# Patient Record
Sex: Female | Born: 1975 | Race: White | Hispanic: No | Marital: Single | Smoking: Current every day smoker
Health system: Southern US, Community
[De-identification: ages and names within clinical notes are randomized; demographics above are authoritative.]

## PROBLEM LIST (undated history)

## (undated) DIAGNOSIS — M51379 Other intervertebral disc degeneration, lumbosacral region without mention of lumbar back pain or lower extremity pain: Secondary | ICD-10-CM

## (undated) DIAGNOSIS — J45909 Unspecified asthma, uncomplicated: Secondary | ICD-10-CM

## (undated) DIAGNOSIS — C801 Malignant (primary) neoplasm, unspecified: Secondary | ICD-10-CM

## (undated) DIAGNOSIS — I10 Essential (primary) hypertension: Secondary | ICD-10-CM

## (undated) DIAGNOSIS — M5137 Other intervertebral disc degeneration, lumbosacral region: Secondary | ICD-10-CM

## (undated) DIAGNOSIS — F1020 Alcohol dependence, uncomplicated: Secondary | ICD-10-CM

## (undated) DIAGNOSIS — B1920 Unspecified viral hepatitis C without hepatic coma: Secondary | ICD-10-CM

## (undated) DIAGNOSIS — N289 Disorder of kidney and ureter, unspecified: Secondary | ICD-10-CM

## (undated) HISTORY — PX: OTHER SURGICAL HISTORY: SHX169

---

## 2010-09-08 DIAGNOSIS — F1020 Alcohol dependence, uncomplicated: Secondary | ICD-10-CM | POA: Diagnosis present

## 2012-11-21 DIAGNOSIS — F10239 Alcohol dependence with withdrawal, unspecified: Secondary | ICD-10-CM | POA: Insufficient documentation

## 2012-11-21 DIAGNOSIS — F10939 Alcohol use, unspecified with withdrawal, unspecified: Secondary | ICD-10-CM | POA: Insufficient documentation

## 2012-12-27 DIAGNOSIS — F419 Anxiety disorder, unspecified: Secondary | ICD-10-CM | POA: Insufficient documentation

## 2013-01-30 DIAGNOSIS — Z85820 Personal history of malignant melanoma of skin: Secondary | ICD-10-CM | POA: Insufficient documentation

## 2015-09-15 DIAGNOSIS — F1994 Other psychoactive substance use, unspecified with psychoactive substance-induced mood disorder: Secondary | ICD-10-CM | POA: Insufficient documentation

## 2016-09-28 DIAGNOSIS — F1994 Other psychoactive substance use, unspecified with psychoactive substance-induced mood disorder: Secondary | ICD-10-CM | POA: Insufficient documentation

## 2017-03-05 ENCOUNTER — Emergency Department
Admission: EM | Admit: 2017-03-05 | Discharge: 2017-03-05 | Disposition: A | Discharge status: Home / Self Care | Payer: Self-pay | Attending: Emergency Medicine | Admitting: Emergency Medicine

## 2017-03-05 ENCOUNTER — Emergency Department: Payer: Self-pay

## 2017-03-05 DIAGNOSIS — F10929 Alcohol use, unspecified with intoxication, unspecified: Secondary | ICD-10-CM | POA: Insufficient documentation

## 2017-03-05 DIAGNOSIS — Y929 Unspecified place or not applicable: Secondary | ICD-10-CM | POA: Insufficient documentation

## 2017-03-05 DIAGNOSIS — F17200 Nicotine dependence, unspecified, uncomplicated: Secondary | ICD-10-CM | POA: Insufficient documentation

## 2017-03-05 DIAGNOSIS — Y999 Unspecified external cause status: Secondary | ICD-10-CM | POA: Insufficient documentation

## 2017-03-05 DIAGNOSIS — Z79899 Other long term (current) drug therapy: Secondary | ICD-10-CM | POA: Insufficient documentation

## 2017-03-05 DIAGNOSIS — I10 Essential (primary) hypertension: Secondary | ICD-10-CM | POA: Insufficient documentation

## 2017-03-05 DIAGNOSIS — S0181XA Laceration without foreign body of other part of head, initial encounter: Secondary | ICD-10-CM | POA: Insufficient documentation

## 2017-03-05 DIAGNOSIS — Y939 Activity, unspecified: Secondary | ICD-10-CM | POA: Insufficient documentation

## 2017-03-05 HISTORY — DX: Essential (primary) hypertension: I10

## 2017-03-05 LAB — URINALYSIS, COMPLETE (UACMP) WITH MICROSCOPIC
Bilirubin Urine: NEGATIVE
Glucose, UA: NEGATIVE mg/dL
Hgb urine dipstick: NEGATIVE
Ketones, ur: NEGATIVE mg/dL
Leukocytes, UA: NEGATIVE
Nitrite: NEGATIVE
Protein, ur: NEGATIVE mg/dL
SPECIFIC GRAVITY, URINE: 1.002 — AB (ref 1.005–1.030)
Squamous Epithelial / LPF: NONE SEEN
pH: 7 (ref 5.0–8.0)

## 2017-03-05 LAB — CBC WITH DIFFERENTIAL/PLATELET
Basophils Absolute: 0.1 10*3/uL (ref 0–0.1)
Basophils Relative: 1 %
EOS ABS: 0.1 10*3/uL (ref 0–0.7)
Eosinophils Relative: 1 %
HCT: 45.6 % (ref 35.0–47.0)
HEMOGLOBIN: 15.9 g/dL (ref 12.0–16.0)
LYMPHS ABS: 2.7 10*3/uL (ref 1.0–3.6)
Lymphocytes Relative: 33 %
MCH: 38.4 pg — AB (ref 26.0–34.0)
MCHC: 34.8 g/dL (ref 32.0–36.0)
MCV: 110.1 fL — ABNORMAL HIGH (ref 80.0–100.0)
MONO ABS: 0.4 10*3/uL (ref 0.2–0.9)
MONOS PCT: 5 %
NEUTROS PCT: 60 %
Neutro Abs: 4.8 10*3/uL (ref 1.4–6.5)
Platelets: 209 10*3/uL (ref 150–440)
RBC: 4.14 MIL/uL (ref 3.80–5.20)
RDW: 13.5 % (ref 11.5–14.5)
WBC: 8.1 10*3/uL (ref 3.6–11.0)

## 2017-03-05 LAB — HEPATIC FUNCTION PANEL
ALBUMIN: 4 g/dL (ref 3.5–5.0)
ALK PHOS: 81 U/L (ref 38–126)
ALT: 44 U/L (ref 14–54)
AST: 84 U/L — AB (ref 15–41)
BILIRUBIN TOTAL: 0.6 mg/dL (ref 0.3–1.2)
Bilirubin, Direct: <0.1 mg/dL — ABNORMAL LOW (ref 0.1–0.5)
Total Protein: 7.4 g/dL (ref 6.5–8.1)

## 2017-03-05 LAB — BASIC METABOLIC PANEL
Anion gap: 13 (ref 5–15)
BUN: 7 mg/dL (ref 6–20)
CALCIUM: 8.9 mg/dL (ref 8.9–10.3)
CO2: 24 mmol/L (ref 22–32)
CREATININE: 0.88 mg/dL (ref 0.44–1.00)
Chloride: 103 mmol/L (ref 101–111)
GFR calc non Af Amer: >60 mL/min (ref >60)
Glucose, Bld: 95 mg/dL (ref 65–99)
Potassium: 3.4 mmol/L — ABNORMAL LOW (ref 3.5–5.1)
SODIUM: 140 mmol/L (ref 135–145)

## 2017-03-05 LAB — URINE DRUG SCREEN, QUALITATIVE (ARMC ONLY)
Amphetamines, Ur Screen: NOT DETECTED
Barbiturates, Ur Screen: NOT DETECTED
Benzodiazepine, Ur Scrn: NOT DETECTED
COCAINE METABOLITE, UR ~~LOC~~: NOT DETECTED
Cannabinoid 50 Ng, Ur ~~LOC~~: NOT DETECTED
MDMA (ECSTASY) UR SCREEN: NOT DETECTED
METHADONE SCREEN, URINE: NOT DETECTED
Opiate, Ur Screen: NOT DETECTED
Phencyclidine (PCP) Ur S: NOT DETECTED
TRICYCLIC, UR SCREEN: NOT DETECTED

## 2017-03-05 LAB — RAPID HIV SCREEN (HIV 1/2 AB+AG)
HIV 1/2 Antibodies: NONREACTIVE
HIV-1 P24 ANTIGEN - HIV24: NONREACTIVE

## 2017-03-05 LAB — POCT PREGNANCY, URINE: PREG TEST UR: NEGATIVE

## 2017-03-05 LAB — HCG, QUANTITATIVE, PREGNANCY: hCG, Beta Chain, Quant, S: <1 m[IU]/mL (ref <5)

## 2017-03-05 LAB — ETHANOL: Alcohol, Ethyl (B): 389 mg/dL (ref <5)

## 2017-03-05 MED ORDER — FENTANYL CITRATE (PF) 100 MCG/2ML IJ SOLN
75.0000 ug | Freq: Once | INTRAMUSCULAR | Status: AC
  Administered 2017-03-05: 75 ug via INTRAVENOUS

## 2017-03-05 MED ORDER — HYDROCODONE-ACETAMINOPHEN 5-325 MG PO TABS: 1.0000 | ORAL_TABLET | Freq: Four times a day (QID) | ORAL | 0 refills | Status: DC | PRN

## 2017-03-05 MED ORDER — SODIUM CHLORIDE 0.9 % IV BOLUS (SEPSIS)
1000.0000 mL | Freq: Once | INTRAVENOUS | Status: AC
  Administered 2017-03-05: 1000 mL via INTRAVENOUS

## 2017-03-05 MED ORDER — TETANUS-DIPHTH-ACELL PERTUSSIS 5-2.5-18.5 LF-MCG/0.5 IM SUSP
0.5000 mL | Freq: Once | INTRAMUSCULAR | Status: AC
  Administered 2017-03-05: 0.5 mL via INTRAMUSCULAR
  Filled 2017-03-05: qty 0.5

## 2017-03-05 MED ORDER — LIDOCAINE HCL (PF) 1 % IJ SOLN
INTRAMUSCULAR | Status: AC
  Filled 2017-03-05: qty 10

## 2017-03-05 MED ORDER — LIDOCAINE HCL (PF) 1 % IJ SOLN
10.0000 mL | Freq: Once | INTRAMUSCULAR | Status: AC
  Administered 2017-03-05: 17:00:00 via INTRADERMAL

## 2017-03-05 MED ORDER — FENTANYL CITRATE (PF) 100 MCG/2ML IJ SOLN
INTRAMUSCULAR | Status: AC
  Filled 2017-03-05: qty 2

## 2017-03-05 MED ORDER — HALOPERIDOL LACTATE 5 MG/ML IJ SOLN
5.0000 mg | Freq: Once | INTRAMUSCULAR | Status: AC
  Administered 2017-03-05: 5 mg via INTRAVENOUS
  Filled 2017-03-05: qty 1

## 2017-03-05 MED ORDER — LIDOCAINE-EPINEPHRINE 1 %-1:100000 IJ SOLN
10.0000 mL | Freq: Once | INTRAMUSCULAR | Status: DC
  Filled 2017-03-05: qty 10

## 2017-03-05 MED ORDER — KETOROLAC TROMETHAMINE 30 MG/ML IJ SOLN
15.0000 mg | Freq: Once | INTRAMUSCULAR | Status: AC
  Administered 2017-03-05: 15 mg via INTRAVENOUS
  Filled 2017-03-05: qty 1

## 2017-03-05 MED ORDER — FENTANYL CITRATE (PF) 100 MCG/2ML IJ SOLN
75.0000 ug | Freq: Once | INTRAMUSCULAR | Status: AC
  Administered 2017-03-05: 75 ug via INTRAVENOUS
  Filled 2017-03-05: qty 2
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
## 2017-03-07 ENCOUNTER — Telehealth: Payer: Self-pay | Admitting: Emergency Medicine

## 2017-03-07 LAB — COMMENT2 - HEP PANEL

## 2017-03-07 LAB — HEPATITIS C ANTIBODY (REFLEX)

## 2017-03-07 NOTE — Telephone Encounter (Signed)
Called patient to discuss follow up plans and hepatitis c results and need for further testing.  Would like to give resources for follow up.  I spoke with family member and she took my number and will have pt call me back tomorrow.

## 2017-04-12 DIAGNOSIS — F329 Major depressive disorder, single episode, unspecified: Secondary | ICD-10-CM | POA: Insufficient documentation

## 2017-04-12 DIAGNOSIS — F32A Depression, unspecified: Secondary | ICD-10-CM | POA: Insufficient documentation

## 2017-08-03 DIAGNOSIS — Z3041 Encounter for surveillance of contraceptive pills: Secondary | ICD-10-CM | POA: Insufficient documentation

## 2018-02-26 DIAGNOSIS — F17200 Nicotine dependence, unspecified, uncomplicated: Secondary | ICD-10-CM | POA: Insufficient documentation

## 2018-07-15 ENCOUNTER — Emergency Department: Payer: Self-pay

## 2018-07-15 ENCOUNTER — Emergency Department
Admission: EM | Admit: 2018-07-15 | Discharge: 2018-07-15 | Disposition: A | Discharge status: Home / Self Care | Payer: Self-pay | Attending: Emergency Medicine | Admitting: Emergency Medicine

## 2018-07-15 ENCOUNTER — Other Ambulatory Visit: Payer: Self-pay

## 2018-07-15 DIAGNOSIS — R000 Tachycardia, unspecified: Secondary | ICD-10-CM | POA: Insufficient documentation

## 2018-07-15 DIAGNOSIS — E860 Dehydration: Secondary | ICD-10-CM | POA: Insufficient documentation

## 2018-07-15 DIAGNOSIS — I10 Essential (primary) hypertension: Secondary | ICD-10-CM | POA: Insufficient documentation

## 2018-07-15 DIAGNOSIS — Y908 Blood alcohol level of 240 mg/100 ml or more: Secondary | ICD-10-CM | POA: Insufficient documentation

## 2018-07-15 DIAGNOSIS — F17200 Nicotine dependence, unspecified, uncomplicated: Secondary | ICD-10-CM | POA: Insufficient documentation

## 2018-07-15 DIAGNOSIS — Z79899 Other long term (current) drug therapy: Secondary | ICD-10-CM | POA: Insufficient documentation

## 2018-07-15 DIAGNOSIS — R062 Wheezing: Secondary | ICD-10-CM | POA: Insufficient documentation

## 2018-07-15 DIAGNOSIS — F10121 Alcohol abuse with intoxication delirium: Secondary | ICD-10-CM | POA: Insufficient documentation

## 2018-07-15 DIAGNOSIS — F10921 Alcohol use, unspecified with intoxication delirium: Secondary | ICD-10-CM

## 2018-07-15 LAB — COMPREHENSIVE METABOLIC PANEL
ALBUMIN: 4.6 g/dL (ref 3.5–5.0)
ALT: 35 U/L (ref 0–44)
AST: 51 U/L — ABNORMAL HIGH (ref 15–41)
Alkaline Phosphatase: 82 U/L (ref 38–126)
Anion gap: 15 (ref 5–15)
BUN: 15 mg/dL (ref 6–20)
CALCIUM: 9.4 mg/dL (ref 8.9–10.3)
CHLORIDE: 107 mmol/L (ref 98–111)
CO2: 24 mmol/L (ref 22–32)
CREATININE: 1.02 mg/dL — AB (ref 0.44–1.00)
GFR calc Af Amer: >60 mL/min (ref >60)
GFR calc non Af Amer: >60 mL/min (ref >60)
Glucose, Bld: 98 mg/dL (ref 70–99)
POTASSIUM: 3.9 mmol/L (ref 3.5–5.1)
SODIUM: 146 mmol/L — AB (ref 135–145)
Total Bilirubin: 0.6 mg/dL (ref 0.3–1.2)
Total Protein: 7.9 g/dL (ref 6.5–8.1)

## 2018-07-15 LAB — URINALYSIS, COMPLETE (UACMP) WITH MICROSCOPIC
Bilirubin Urine: NEGATIVE
GLUCOSE, UA: NEGATIVE mg/dL
HGB URINE DIPSTICK: NEGATIVE
Ketones, ur: NEGATIVE mg/dL
Leukocytes, UA: NEGATIVE
Nitrite: NEGATIVE
PH: 6 (ref 5.0–8.0)
Protein, ur: NEGATIVE mg/dL
SPECIFIC GRAVITY, URINE: 1.005 (ref 1.005–1.030)

## 2018-07-15 LAB — CBC WITH DIFFERENTIAL/PLATELET
ABS IMMATURE GRANULOCYTES: 0.02 10*3/uL (ref 0.00–0.07)
BASOS PCT: 0 %
Basophils Absolute: 0 10*3/uL (ref 0.0–0.1)
EOS ABS: 0.2 10*3/uL (ref 0.0–0.5)
Eosinophils Relative: 3 %
HCT: 47.4 % — ABNORMAL HIGH (ref 36.0–46.0)
Hemoglobin: 16.3 g/dL — ABNORMAL HIGH (ref 12.0–15.0)
Immature Granulocytes: 0 %
Lymphocytes Relative: 57 %
Lymphs Abs: 4.5 10*3/uL — ABNORMAL HIGH (ref 0.7–4.0)
MCH: 31.8 pg (ref 26.0–34.0)
MCHC: 34.4 g/dL (ref 30.0–36.0)
MCV: 92.6 fL (ref 80.0–100.0)
MONO ABS: 0.5 10*3/uL (ref 0.1–1.0)
MONOS PCT: 6 %
NEUTROS ABS: 2.7 10*3/uL (ref 1.7–7.7)
NEUTROS PCT: 34 %
PLATELETS: 312 10*3/uL (ref 150–400)
RBC: 5.12 MIL/uL — AB (ref 3.87–5.11)
RDW: 12.2 % (ref 11.5–15.5)
WBC: 8 10*3/uL (ref 4.0–10.5)
nRBC: 0 % (ref 0.0–0.2)

## 2018-07-15 LAB — URINE DRUG SCREEN, QUALITATIVE (ARMC ONLY)
AMPHETAMINES, UR SCREEN: NOT DETECTED
Barbiturates, Ur Screen: NOT DETECTED
Benzodiazepine, Ur Scrn: NOT DETECTED
CANNABINOID 50 NG, UR ~~LOC~~: NOT DETECTED
COCAINE METABOLITE, UR ~~LOC~~: NOT DETECTED
MDMA (ECSTASY) UR SCREEN: NOT DETECTED
Methadone Scn, Ur: NOT DETECTED
OPIATE, UR SCREEN: NOT DETECTED
PHENCYCLIDINE (PCP) UR S: NOT DETECTED
Tricyclic, Ur Screen: NOT DETECTED

## 2018-07-15 LAB — POCT PREGNANCY, URINE: Preg Test, Ur: NEGATIVE

## 2018-07-15 LAB — ACETAMINOPHEN LEVEL

## 2018-07-15 LAB — ETHANOL: ALCOHOL ETHYL (B): 474 mg/dL — AB (ref <10)

## 2018-07-15 LAB — SALICYLATE LEVEL: Salicylate Lvl: <7 mg/dL (ref 2.8–30.0)

## 2018-07-15 LAB — CK: Total CK: 159 U/L (ref 38–234)

## 2018-07-15 MED ORDER — SODIUM CHLORIDE 0.9 % IV BOLUS
1000.0000 mL | Freq: Once | INTRAVENOUS | Status: AC
  Administered 2018-07-15: 1000 mL via INTRAVENOUS

## 2018-07-15 MED ORDER — HALOPERIDOL LACTATE 5 MG/ML IJ SOLN
5.0000 mg | Freq: Once | INTRAMUSCULAR | Status: AC
  Administered 2018-07-15: 5 mg via INTRAVENOUS
  Filled 2018-07-15: qty 1

## 2018-07-15 MED ORDER — IPRATROPIUM-ALBUTEROL 0.5-2.5 (3) MG/3ML IN SOLN
RESPIRATORY_TRACT | Status: AC
  Filled 2018-07-15: qty 3

## 2018-07-15 MED ORDER — IPRATROPIUM-ALBUTEROL 0.5-2.5 (3) MG/3ML IN SOLN
3.0000 mL | Freq: Once | RESPIRATORY_TRACT | Status: AC
  Administered 2018-07-15: 3 mL via RESPIRATORY_TRACT

## 2018-07-15 MED ORDER — ALBUTEROL SULFATE HFA 108 (90 BASE) MCG/ACT IN AERS: 2.0000 | INHALATION_SPRAY | Freq: Four times a day (QID) | RESPIRATORY_TRACT | 0 refills | Status: DC | PRN

## 2018-07-15 NOTE — Discharge Instructions (Signed)
It was a pleasure to take care of you today, and thank you for coming to our emergency department.  If you have any questions or concerns before leaving please ask the nurse to grab me and I'm more than happy to go through your aftercare instructions again.  If you were prescribed any opioid pain medication today such as Norco, Vicodin, Percocet, morphine, hydrocodone, or oxycodone please make sure you do not drive when you are taking this medication as it can alter your ability to drive safely.  If you have any concerns once you are home that you are not improving or are in fact getting worse before you can make it to your follow-up appointment, please do not hesitate to call 911 and come back for further evaluation.  Darel Hong, MD  Results for orders placed or performed during the hospital encounter of 07/15/18  Acetaminophen level  Result Value Ref Range   Acetaminophen (Tylenol), Serum <10 (L) 10 - 30 ug/mL  Comprehensive metabolic panel  Result Value Ref Range   Sodium 146 (H) 135 - 145 mmol/L   Potassium 3.9 3.5 - 5.1 mmol/L   Chloride 107 98 - 111 mmol/L   CO2 24 22 - 32 mmol/L   Glucose, Bld 98 70 - 99 mg/dL   BUN 15 6 - 20 mg/dL   Creatinine, Ser 1.02 (H) 0.44 - 1.00 mg/dL   Calcium 9.4 8.9 - 10.3 mg/dL   Total Protein 7.9 6.5 - 8.1 g/dL   Albumin 4.6 3.5 - 5.0 g/dL   AST 51 (H) 15 - 41 U/L   ALT 35 0 - 44 U/L   Alkaline Phosphatase 82 38 - 126 U/L   Total Bilirubin 0.6 0.3 - 1.2 mg/dL   GFR calc non Af Amer >60 >60 mL/min   GFR calc Af Amer >60 >60 mL/min   Anion gap 15 5 - 15  Ethanol  Result Value Ref Range   Alcohol, Ethyl (B) 474 (HH) <86 mg/dL  Salicylate level  Result Value Ref Range   Salicylate Lvl <5.7 2.8 - 30.0 mg/dL  CBC with Differential  Result Value Ref Range   WBC 8.0 4.0 - 10.5 K/uL   RBC 5.12 (H) 3.87 - 5.11 MIL/uL   Hemoglobin 16.3 (H) 12.0 - 15.0 g/dL   HCT 47.4 (H) 36.0 - 46.0 %   MCV 92.6 80.0 - 100.0 fL   MCH 31.8 26.0 - 34.0 pg   MCHC 34.4 30.0 - 36.0 g/dL   RDW 12.2 11.5 - 15.5 %   Platelets 312 150 - 400 K/uL   nRBC 0.0 0.0 - 0.2 %   Neutrophils Relative % 34 %   Neutro Abs 2.7 1.7 - 7.7 K/uL   Lymphocytes Relative 57 %   Lymphs Abs 4.5 (H) 0.7 - 4.0 K/uL   Monocytes Relative 6 %   Monocytes Absolute 0.5 0.1 - 1.0 K/uL   Eosinophils Relative 3 %   Eosinophils Absolute 0.2 0.0 - 0.5 K/uL   Basophils Relative 0 %   Basophils Absolute 0.0 0.0 - 0.1 K/uL   Immature Granulocytes 0 %   Abs Immature Granulocytes 0.02 0.00 - 0.07 K/uL  Urinalysis, Complete w Microscopic  Result Value Ref Range   Color, Urine STRAW (A) YELLOW   APPearance CLEAR (A) CLEAR   Specific Gravity, Urine 1.005 1.005 - 1.030   pH 6.0 5.0 - 8.0   Glucose, UA NEGATIVE NEGATIVE mg/dL   Hgb urine dipstick NEGATIVE NEGATIVE   Bilirubin Urine NEGATIVE  NEGATIVE   Ketones, ur NEGATIVE NEGATIVE mg/dL   Protein, ur NEGATIVE NEGATIVE mg/dL   Nitrite NEGATIVE NEGATIVE   Leukocytes, UA NEGATIVE NEGATIVE   RBC / HPF 0-5 0 - 5 RBC/hpf   WBC, UA 0-5 0 - 5 WBC/hpf   Bacteria, UA RARE (A) NONE SEEN   Squamous Epithelial / LPF 0-5 0 - 5  Urine Drug Screen, Qualitative  Result Value Ref Range   Tricyclic, Ur Screen NONE DETECTED NONE DETECTED   Amphetamines, Ur Screen NONE DETECTED NONE DETECTED   MDMA (Ecstasy)Ur Screen NONE DETECTED NONE DETECTED   Cocaine Metabolite,Ur [place] NONE DETECTED NONE DETECTED   Opiate, Ur Screen NONE DETECTED NONE DETECTED   Phencyclidine (PCP) Ur S NONE DETECTED NONE DETECTED   Cannabinoid 50 Ng, Ur [place] NONE DETECTED NONE DETECTED   Barbiturates, Ur Screen NONE DETECTED NONE DETECTED   Benzodiazepine, Ur Scrn NONE DETECTED NONE DETECTED   Methadone Scn, Ur NONE DETECTED NONE DETECTED  CK  Result Value Ref Range   Total CK 159 38 - 234 U/L  Pregnancy, urine POC  Result Value Ref Range   Preg Test, Ur NEGATIVE NEGATIVE   Ct Head Wo Contrast  Result Date: 07/15/2018 CLINICAL DATA:  Altered level of consciousness.  Found unresponsive. EXAM: CT HEAD WITHOUT CONTRAST TECHNIQUE: Contiguous axial images were obtained from the base of the skull through the vertex without intravenous contrast. COMPARISON:  03/05/2017 FINDINGS: Brain: No evidence of acute infarction, hemorrhage, hydrocephalus, extra-axial collection or mass lesion/mass effect. Vascular: No hyperdense vessel or unexpected calcification. Skull: No osseous abnormality. Sinuses/Orbits: Visualized paranasal sinuses are clear. Visualized mastoid sinuses are clear. Visualized orbits demonstrate no focal abnormality. Other: None IMPRESSION: No acute intracranial pathology. Electronically Signed   By: Kathreen Devoid   On: 07/15/2018 03:00

## 2018-07-15 NOTE — ED Provider Notes (Signed)
[facility] Emergency Department Provider Note  ____________________________________________   First MD Initiated Contact with Patient 07/15/18 0136     (approximate)  I have reviewed the triage vital signs and the nursing notes.   HISTORY  Chief Complaint Altered Mental Status  Level 5 exemption history limited by the patient's clinical condition  HPI Abigail Burton is a 42 y.o. female is brought to the emergency department via EMS after being found minimally responsive by law enforcement.  According to EMS the patient was found unresponsive in a car with an unknown female.  The patient began to mumble something about drinking alcohol and using unknown drugs and police gave the patient 2 mg of Narcan with no effect.  She was then transported to the emergency department given her profound altered mental status.  The patient herself is not very forthcoming saying only she "drank and use drugs."    Past Medical History:  Diagnosis Date  . Hypertension     There are no active problems to display for this patient.   Past Surgical History:  Procedure Laterality Date  . cancer removal      Prior to Admission medications   Medication Sig Start Date End Date Taking? Authorizing Provider  buPROPion (WELLBUTRIN XL) 300 MG 24 hr tablet Take 300 mg by mouth daily. 05/31/18  Yes [provider]  gabapentin (NEURONTIN) 300 MG capsule Take 900 mg by mouth 3 (three) times daily.  02/23/17  Yes [provider]  sertraline (ZOLOFT) 50 MG tablet Take 100 mg by mouth daily. 05/31/18  Yes [provider]  HYDROcodone-acetaminophen (NORCO) 5-325 MG tablet Take 1 tablet by mouth every 6 (six) hours as needed for severe pain. Patient not taking: Reported on 07/15/2018 03/05/17   Darel Hong, MD    Allergies Sulfa antibiotics  No family history on file.  Social History Social History   Tobacco Use  . Smoking status: Current Every Day  Smoker  . Smokeless tobacco: Never Used  Substance Use Topics  . Alcohol use: Yes  . Drug use: No    Review of Systems Level 5 exemption history limited by the patient's clinical condition ____________________________________________   PHYSICAL EXAM:  VITAL SIGNS: ED Triage Vitals  Enc Vitals Group     BP      Pulse      Resp      Temp      Temp src      SpO2      Weight      Height      Head Circumference      Peak Flow      Pain Score      Pain Loc      Pain Edu?      Excl. in [place]?     Constitutional: Extremely intoxicated slurring her speech and sexually inappropriate with the staff.  Unable to hold her head up Eyes: PERRL EOMI. 5 mm and sluggish Head: Atraumatic. Nose: No congestion/rhinnorhea. Mouth/Throat: No trismus Neck: No stridor.  No meningismus Cardiovascular: Tachycardic rate, regular rhythm. Grossly normal heart sounds.  Good peripheral circulation. Respiratory: Decreased respiratory effort.  No retractions. Lungs CTAB and moving good air Gastrointestinal: Soft nontender Musculoskeletal: No lower extremity edema   Neurologic:'s all 4 extremities Skin:  Skin is warm, dry and intact. No rash noted. Psychiatric: Profoundly intoxicated   ____________________________________________   DIFFERENTIAL includes but not limited to  Alcohol intoxication, opiate intoxication, intracerebral hemorrhage, metabolic derangement ____________________________________________  LABS (all labs ordered are listed, but only abnormal results are displayed)  Labs Reviewed  ACETAMINOPHEN LEVEL - Abnormal; Notable for the following components:      Result Value   Acetaminophen (Tylenol), Serum <10 (*)    All other components within normal limits  COMPREHENSIVE METABOLIC PANEL - Abnormal; Notable for the following components:   Sodium 146 (*)    Creatinine, Ser 1.02 (*)    AST 51 (*)    All other components within normal limits  ETHANOL - Abnormal; Notable for the  following components:   Alcohol, Ethyl (B) 474 (*)    All other components within normal limits  CBC WITH DIFFERENTIAL/PLATELET - Abnormal; Notable for the following components:   RBC 5.12 (*)    Hemoglobin 16.3 (*)    HCT 47.4 (*)    Lymphs Abs 4.5 (*)    All other components within normal limits  URINALYSIS, COMPLETE (UACMP) WITH MICROSCOPIC - Abnormal; Notable for the following components:   Color, Urine STRAW (*)    APPearance CLEAR (*)    Bacteria, UA RARE (*)    All other components within normal limits  SALICYLATE LEVEL  URINE DRUG SCREEN, QUALITATIVE ([facility] ONLY)  CK  POCT PREGNANCY, URINE    Lab work reviewed by me was extraordinarily high ethanol level and hemoglobin of 16 consistent with acute dehydration __________________________________________  EKG  ED ECG REPORT I, Darel Hong, the attending physician, personally viewed and interpreted this ECG.  Date: 07/15/2018 EKG Time:  Rate: 124 Rhythm: Sinus tachycardia QRS Axis: normal Intervals: normal ST/T Wave abnormalities: normal Narrative Interpretation: no evidence of acute ischemia  ____________________________________________  RADIOLOGY  CT scan of the head reviewed by me with no acute disease ____________________________________________   PROCEDURES  Procedure(s) performed: no  .Critical Care Performed by: Darel Hong, MD Authorized by: Darel Hong, MD   Critical care provider statement:    Critical care time (minutes):  30   Critical care time was exclusive of:  Separately billable procedures and treating other patients   Critical care was necessary to treat or prevent imminent or life-threatening deterioration of the following conditions:  [place]oxid[place]   Critical care was time spent personally by me on the following activities:  Development of treatment plan with patient or surrogate, discussions with consultants, evaluation of patient's response to treatment, examination of patient,  obtaining history from patient or surrogate, ordering and performing treatments and interventions, ordering and review of laboratory studies, ordering and review of radiographic studies, pulse oximetry, re-evaluation of patient's condition and review of old charts    Critical Care performed: Yes  ____________________________________________   INITIAL IMPRESSION / ASSESSMENT AND PLAN / ED COURSE  Pertinent labs & imaging results that were available during my care of the patient were reviewed by me and considered in my medical decision making (see chart for details).   As part of my medical decision making, I reviewed the following data within the [place] History obtained from family if available, nursing notes, old chart and ekg, as well as notes from prior ED visits.  The patient comes to the emergency department profoundly altered with self-reported alcohol and unknown other drug use.  She is difficult to fully evaluate as she is moving around, uncooperative, and sexually inappropriate with the staff.  Given 5 mg of IV haloperidol for her own safety to help facilitate a medical work-up with good results.  Broad lab work is pending but I think she also requires a  head CT given the lack of history and altered mental status.  Fortunately the patient's head CT is negative for acute pathology.  Her ethanol level is extraordinarily high at 474 likely explaining her symptoms.  She has no family or friends listed and cannot provide any numbers so we will allow her to sober up here in the emergency department.  ----------------------------------------- 8:13 AM on 07/15/2018 -----------------------------------------  The patient is clearly still intoxicated although now able to wake up and hold somewhat of a conversation.  She said that she is an alcoholic and tonight "relapsed".  She said she has a friend she can call who can take her home to a safe place and she would like to  leave now.  I will plan on discharging her but she can only leave if she has a sober ride.  She is not currently withdrawing from alcohol.      ____________________________________________   FINAL CLINICAL IMPRESSION(S) / ED DIAGNOSES  Final diagnoses:  Alcohol intoxication with delirium ([place])      NEW MEDICATIONS STARTED DURING THIS VISIT:  New Prescriptions   No medications on file     Note:  This document was prepared using Dragon voice recognition software and may include unintentional dictation errors.     Darel Hong, MD 07/15/18 [phone]

## 2018-07-15 NOTE — ED Triage Notes (Signed)
Pt arrives to ED via [facility] with c/o being found unresponsive by law enforcement. Per EMS, pt was found in a car at a restaurant unresponsive with an unknown female. EMS states pt admitted to ETOH and drug use tonight, but refuses to state which drug(s) she ingested. EMS reports PD gave 2mg  Narcan without significant effect. Pt arrives obviously intoxicated with very unsteady gait and slurred speech.

## 2018-07-15 NOTE — ED Provider Notes (Addendum)
[facility]  I accepted care from Dr. Mable Paris ____________________________________________    LABS (pertinent positives/negatives)  I, Lisa Roca, MD have personally reviewed the lab reports noted below.  Labs Reviewed  ACETAMINOPHEN LEVEL - Abnormal; Notable for the following components:      Result Value   Acetaminophen (Tylenol), Serum <10 (*)    All other components within normal limits  COMPREHENSIVE METABOLIC PANEL - Abnormal; Notable for the following components:   Sodium 146 (*)    Creatinine, Ser 1.02 (*)    AST 51 (*)    All other components within normal limits  ETHANOL - Abnormal; Notable for the following components:   Alcohol, Ethyl (B) 474 (*)    All other components within normal limits  CBC WITH DIFFERENTIAL/PLATELET - Abnormal; Notable for the following components:   RBC 5.12 (*)    Hemoglobin 16.3 (*)    HCT 47.4 (*)    Lymphs Abs 4.5 (*)    All other components within normal limits  URINALYSIS, COMPLETE (UACMP) WITH MICROSCOPIC - Abnormal; Notable for the following components:   Color, Urine STRAW (*)    APPearance CLEAR (*)    Bacteria, UA RARE (*)    All other components within normal limits  SALICYLATE LEVEL  URINE DRUG SCREEN, QUALITATIVE ([facility] ONLY)  CK  POCT PREGNANCY, URINE     ____________________________________________    RADIOLOGY All xrays were viewed by me. Imaging interpreted by radiologist.  I, Lisa Roca MD have personally reviewed the imaging report noted below.  CXr:  IMPRESSION: No acute intracranial pathology.  ____________________________________________   PROCEDURES  Procedure(s) performed: None  Procedures  Critical Care performed: None  ____________________________________________   INITIAL IMPRESSION / ASSESSMENT AND PLAN / ED COURSE   Pertinent labs & imaging results that were available during my care of the patient were reviewed by me and considered in my medical decision  making (see chart for details).   I accepted care for patient, she was here with alcohol intoxication was significantly tachycardic.  UDS negative for other found coingestants.  Patient awake alert this morning, given quite orthostatic with respect to her heart rate she was given 2 L of fluid.  Heart rate was much better but still about 105 at rest and up to 09/13/1999 20 while standing so I did plan on feeding her a meal and then giving her another liter fluid here.  Patient wheezing.  She is a smoker.  She does not typically have a history of COPD or asthma.  I am to give her a DuoNeb treatment here for that.  I did add on x-ray.   Chest x-ray is okay.  After additional fluids, heart rate did come down to 100.  Patient is okay for outpatient follow-up at this point.   CONSULTATIONS: None    Patient / Family / Caregiver informed of clinical course, medical decision-making process, and agree with plan.   ____________________________________________   FINAL CLINICAL IMPRESSION(S) / ED DIAGNOSES  Final diagnoses:  Alcohol intoxication with delirium ([place])  Sinus tachycardia  Dehydration  Wheezing        Lisa Roca, MD 07/15/18 1543    Lisa Roca, MD 07/15/18 1544

## 2018-07-15 NOTE — ED Notes (Signed)
Assisted to toilet.  HR increased to 120 when up but has improved.

## 2018-07-15 NOTE — ED Notes (Signed)
Pt wakes to voice. Alert and oriented X 3. Will bring food and drink.  Will DC telesitter.  Pt remembers being at bar drinking.  Attempted to call boyfriend per pt request, no answer.

## 2018-07-15 NOTE — ED Notes (Signed)
Assisted to bathroom

## 2018-07-15 NOTE — ED Notes (Signed)
Pt's blood samples labeled with white chart labels instead of yellow pt labels d/t being locked out from printing while lab in process.

## 2018-07-15 NOTE — ED Notes (Signed)
Attempted to call boyfriend again and his work without success.  Pt notified.

## 2018-07-15 NOTE — ED Notes (Signed)
Patient transported to X-ray 

## 2018-07-15 NOTE — ED Notes (Signed)
Dr Mable Paris made aware of pt's elevated ETOH level as reported by lab at this time. ETOH 474 mg/dL

## 2018-07-15 NOTE — ED Notes (Signed)
Pt placed on 2L O2 via [place] at this time d/t O2 sats being in the mid/high 80's on RA; sats noted to immediately improve to 93-94%.

## 2018-10-28 ENCOUNTER — Other Ambulatory Visit: Payer: Self-pay

## 2018-10-28 ENCOUNTER — Emergency Department: Payer: Self-pay

## 2018-10-28 ENCOUNTER — Emergency Department
Admission: EM | Admit: 2018-10-28 | Discharge: 2018-10-29 | Disposition: A | Discharge status: Home / Self Care | Payer: Self-pay | Attending: Emergency Medicine | Admitting: Emergency Medicine

## 2018-10-28 DIAGNOSIS — R079 Chest pain, unspecified: Secondary | ICD-10-CM | POA: Insufficient documentation

## 2018-10-28 DIAGNOSIS — Z79899 Other long term (current) drug therapy: Secondary | ICD-10-CM | POA: Insufficient documentation

## 2018-10-28 DIAGNOSIS — F10920 Alcohol use, unspecified with intoxication, uncomplicated: Secondary | ICD-10-CM | POA: Insufficient documentation

## 2018-10-28 DIAGNOSIS — I10 Essential (primary) hypertension: Secondary | ICD-10-CM | POA: Insufficient documentation

## 2018-10-28 DIAGNOSIS — R52 Pain, unspecified: Secondary | ICD-10-CM

## 2018-10-28 DIAGNOSIS — R109 Unspecified abdominal pain: Secondary | ICD-10-CM

## 2018-10-28 DIAGNOSIS — R1030 Lower abdominal pain, unspecified: Secondary | ICD-10-CM | POA: Insufficient documentation

## 2018-10-28 DIAGNOSIS — N7011 Chronic salpingitis: Secondary | ICD-10-CM | POA: Insufficient documentation

## 2018-10-28 DIAGNOSIS — F17200 Nicotine dependence, unspecified, uncomplicated: Secondary | ICD-10-CM | POA: Insufficient documentation

## 2018-10-28 LAB — CBC
HEMATOCRIT: 41.4 % (ref 36.0–46.0)
HEMOGLOBIN: 14.9 g/dL (ref 12.0–15.0)
MCH: 35.6 pg — AB (ref 26.0–34.0)
MCHC: 36 g/dL (ref 30.0–36.0)
MCV: 98.8 fL (ref 80.0–100.0)
Platelets: 105 10*3/uL — ABNORMAL LOW (ref 150–400)
RBC: 4.19 MIL/uL (ref 3.87–5.11)
RDW: 14.4 % (ref 11.5–15.5)
WBC: 3.8 10*3/uL — ABNORMAL LOW (ref 4.0–10.5)
nRBC: 0 % (ref 0.0–0.2)

## 2018-10-28 LAB — COMPREHENSIVE METABOLIC PANEL
ALK PHOS: 143 U/L — AB (ref 38–126)
ALT: 85 U/L — AB (ref 0–44)
AST: 265 U/L — ABNORMAL HIGH (ref 15–41)
Albumin: 4.2 g/dL (ref 3.5–5.0)
Anion gap: 15 (ref 5–15)
BILIRUBIN TOTAL: 0.9 mg/dL (ref 0.3–1.2)
BUN: 5 mg/dL — ABNORMAL LOW (ref 6–20)
CALCIUM: 8.1 mg/dL — AB (ref 8.9–10.3)
CO2: 26 mmol/L (ref 22–32)
CREATININE: 0.68 mg/dL (ref 0.44–1.00)
Chloride: 98 mmol/L (ref 98–111)
GFR calc non Af Amer: >60 mL/min (ref >60)
GLUCOSE: 112 mg/dL — AB (ref 70–99)
Potassium: 2.6 mmol/L — CL (ref 3.5–5.1)
Sodium: 139 mmol/L (ref 135–145)
Total Protein: 7.3 g/dL (ref 6.5–8.1)

## 2018-10-28 LAB — URINALYSIS, COMPLETE (UACMP) WITH MICROSCOPIC
Bacteria, UA: NONE SEEN
Bilirubin Urine: NEGATIVE
GLUCOSE, UA: NEGATIVE mg/dL
Ketones, ur: NEGATIVE mg/dL
Nitrite: NEGATIVE
PH: 8 (ref 5.0–8.0)
PROTEIN: NEGATIVE mg/dL
SPECIFIC GRAVITY, URINE: 1.002 — AB (ref 1.005–1.030)

## 2018-10-28 LAB — TROPONIN I

## 2018-10-28 LAB — ETHANOL: Alcohol, Ethyl (B): 361 mg/dL (ref <10)

## 2018-10-28 MED ORDER — POTASSIUM CHLORIDE CRYS ER 10 MEQ PO TBCR: 10.0000 meq | EXTENDED_RELEASE_TABLET | Freq: Two times a day (BID) | ORAL | 0 refills | Status: DC

## 2018-10-28 MED ORDER — POTASSIUM CHLORIDE CRYS ER 20 MEQ PO TBCR
40.0000 meq | EXTENDED_RELEASE_TABLET | Freq: Once | ORAL | Status: AC
  Administered 2018-10-28: 40 meq via ORAL
  Filled 2018-10-28: qty 2

## 2018-10-28 MED ORDER — SODIUM CHLORIDE 0.9 % IV BOLUS
1000.0000 mL | Freq: Once | INTRAVENOUS | Status: AC
  Administered 2018-10-28: 1000 mL via INTRAVENOUS

## 2018-10-28 MED ORDER — ONDANSETRON HCL 4 MG/2ML IJ SOLN
4.0000 mg | Freq: Once | INTRAMUSCULAR | Status: AC
  Administered 2018-10-28: 4 mg via INTRAVENOUS
  Filled 2018-10-28: qty 2

## 2018-10-28 MED ORDER — KETOROLAC TROMETHAMINE 30 MG/ML IJ SOLN
30.0000 mg | Freq: Once | INTRAMUSCULAR | Status: AC
  Administered 2018-10-28: 30 mg via INTRAVENOUS
  Filled 2018-10-28: qty 1

## 2018-10-28 NOTE — ED Notes (Signed)
EDP made aware of critical lab. 

## 2018-10-28 NOTE — ED Triage Notes (Signed)
Of note, patient now admits to smoking a joint today because "I'm trying to relax myself and I'm hurting so bad." Patient continues to add complaint after complaint while in triage.

## 2018-10-28 NOTE — ED Notes (Signed)
EDP speaking with patient.

## 2018-10-28 NOTE — ED Notes (Addendum)
Patient to waiting room via wheelchair by EMS. Per EMS patient complains chest pain today and UTI symptoms with dysuria for 2 weeks, +ETOH on board.  BP -172/114, pulse oxi 98% on room air, HR -  81 normal sinus on monitor, Temp -  98.8, cbg 138.  18g angiocath in right ac, received Aspirin 324mg  by mouth.

## 2018-10-28 NOTE — Discharge Instructions (Addendum)
Please abstain from drinking alcohol.  Please drink plenty of fluids.  Please follow-up with your doctor in the next 1 to 2 days for recheck/reevaluation.  Please fill and begin taking your potassium supplements as prescribed for the next 10 days.  Please follow-up with your doctor next week for recheck of your potassium levels.  Return to the emergency department for any symptoms personally concerning to yourself.

## 2018-10-28 NOTE — ED Notes (Signed)
Patient to [facility] via wheelchair. As this RN was walking away she stated she needed to use the bathroom. Gave instructions to where it was, she states "NO I'm dizzy and nauseated and you're gonna help me." MD notified of patients arrival.

## 2018-10-28 NOTE — ED Notes (Signed)
Patient transported to CT 

## 2018-10-28 NOTE — ED Notes (Signed)
Patient assisted to bathroom by this Probation officer. Patient states having right and left flank pain 9/10. Patient states being dizzy and just not feeling well. Patient states having symptoms for over 2 weeks.

## 2018-10-28 NOTE — ED Provider Notes (Signed)
[facility] Emergency Department Provider Note  Time seen: 10:23 PM  I have reviewed the triage vital signs and the nursing notes.   HISTORY  Chief Complaint Flank Pain (bilateral- Multiple medical complaints ); Polydipsia ("as can be"); Alcohol Intoxication ("A drink and a half"); and Urinary Tract Infection ("I don't feel good at all." I'm weak, I'm dizzy, I don't feel good at all". )   HPI Abigail Burton is a 43 y.o. female with a past medical history of hypertension presents to the emergency department for various complaints.  According to the patient she states "I just do not feel well."  When asked to elaborate patient states she is been having pain with urination described as a burning with pain in her abdomen especially the left side of her abdomen and left flank.  States it feels like she has a urinary tract infection that is in her kidney.  When asked if she has a fever she states subjective fever at home.  When asked about nausea or vomiting states she began vomiting several times today.  When asked about diarrhea she states her stools loose.  When asked about chest pain she states she has been having pain in her chest for the past 2 weeks.  When asked about shortness of breath she states she is short of breath all the time.  When asked about coughing she states she has been coughing.  Throughout exam patient states she just hurts everywhere, and is asking for pain medication.  Patient denies drug use.  Does admit to some alcohol use tonight.   Past Medical History:  Diagnosis Date  . Hypertension     There are no active problems to display for this patient.   Past Surgical History:  Procedure Laterality Date  . cancer removal      Prior to Admission medications   Medication Sig Start Date End Date Taking? Authorizing Provider  albuterol (PROVENTIL HFA;VENTOLIN HFA) 108 (90 Base) MCG/ACT inhaler Inhale 2 puffs into the lungs every 6 (six) hours as  needed for wheezing or shortness of breath. 07/15/18   Lisa Roca, MD  buPROPion (WELLBUTRIN XL) 300 MG 24 hr tablet Take 300 mg by mouth daily. 05/31/18   [provider]  gabapentin (NEURONTIN) 300 MG capsule Take 900 mg by mouth 3 (three) times daily.  02/23/17   [provider]  HYDROcodone-acetaminophen (NORCO) 5-325 MG tablet Take 1 tablet by mouth every 6 (six) hours as needed for severe pain. Patient not taking: Reported on 07/15/2018 03/05/17   Darel Hong, MD  sertraline (ZOLOFT) 50 MG tablet Take 100 mg by mouth daily. 05/31/18   [provider]    Allergies  Allergen Reactions  . Sulfa Antibiotics     No family history on file.  Social History Social History   Tobacco Use  . Smoking status: Current Every Day Smoker  . Smokeless tobacco: Never Used  Substance Use Topics  . Alcohol use: Yes  . Drug use: No    Review of Systems Constitutional: Subjective fever/chills.  Positive for weakness.  Positive for dizziness.  Positive for pain all over Cardiovascular: Patient states chest pain times weeks. Respiratory: Patient states shortness of breath. Gastrointestinal: Patient states abdominal pain worse on the left side.  Positive for vomiting and diarrhea. Genitourinary: Patient states dysuria with foul smell to her urine. Musculoskeletal: Pain in her back due to degenerative disc disease per patient. Skin: Negative for skin complaints  Neurological: Negative for headache All  other ROS negative  ____________________________________________   PHYSICAL EXAM:  VITAL SIGNS: ED Triage Vitals [10/28/18 2120]  Enc Vitals Group     BP (!) 147/100     Pulse Rate 98     Resp 18     Temp 98.1 F (36.7 C)     Temp Source Oral     SpO2 98 %     Weight 126 lb (57.2 kg)     Height 5\' 1"  (1.549 m)     Head Circumference      Peak Flow      Pain Score 10     Pain Loc      Pain Edu?      Excl. in [place]?    Constitutional: Patient is awake and  alert, tearful at times thinks he does not feel well, states she hurts all over her body. Eyes: Normal exam ENT   Head: Normocephalic and atraumatic.   Mouth/Throat: Mucous membranes are moist. Cardiovascular: Normal rate, regular rhythm.  No murmur. Respiratory: Normal respiratory effort without tachypnea nor retractions. Breath sounds are clear, no wheeze rales or rhonchi. Gastrointestinal: Soft, mild suprapubic tenderness.  Moderate left CVA tenderness otherwise benign abdominal exam. Musculoskeletal: Nontender with normal range of motion in all extremities.  Neurologic:  Normal speech and language. No gross focal neurologic deficits Skin:  Skin is warm, dry and intact.  Psychiatric: Mood and affect are normal.   ____________________________________________    EKG  EKG viewed and interpreted by myself shows a sinus tachycardia 101 bpm with a narrow QRS, normal axis, normal intervals, nonspecific no concerning ST changes.  ____________________________________________    RADIOLOGY  Imaging pending  ____________________________________________   INITIAL IMPRESSION / ASSESSMENT AND PLAN / ED COURSE  Pertinent labs & imaging results that were available during my care of the patient were reviewed by me and considered in my medical decision making (see chart for details).  Patient presents to the emergency department with multiple complaints think she just does not feel well.  Only complaint patient voiced was of dysuria and left-sided abdominal pain.  Patient had a nearly pan positive review of systems.  Patient has clear lung sounds, normal heart sounds.  On exam she has slight suprapubic tenderness moderate left CVA tenderness.  We will check labs.  Given the patient's somewhat erratic behavior we will also check an alcohol level and urine drug screen.  Patient does admit to 1.5 drinks tonight.  Denies drug use.  We will also proceed with a CT renal scan to rule out  ureterolithiasis.  We will treat the patient with Toradol and Zofran as well as IV fluids while awaiting results.  Patient agreeable plan of care.  Patient care signed out to Dr. Beather Arbour.  ____________________________________________   FINAL CLINICAL IMPRESSION(S) / ED DIAGNOSES  Abdominal pain Chest pain Alcohol intoxication   Harvest Dark, MD 10/31/18 2149

## 2018-10-28 NOTE — ED Triage Notes (Signed)
Patient presents intoxicated to the ED. She has multiple medical complaints from head to toe to include but not limited to headache, thirsty, UTI, chest pain, productive cough of yellow phlegm (smoker), not feeling well. Continues to add complaints and now is crying, stating "I just hurt"

## 2018-10-28 NOTE — ED Notes (Signed)
Patient returned from CT

## 2018-10-28 NOTE — ED Notes (Signed)
Patient stopping everyone that walks by subwait to complain that her chest is hurting "really really bad." Is wondering why she is having to wait since her heart is hurting "really really bad like this." Charge nurse notified. Will place in hallway bed for MD evaluation on multiple complaints.

## 2018-10-29 ENCOUNTER — Emergency Department: Payer: Self-pay

## 2018-10-29 LAB — URINE DRUG SCREEN, QUALITATIVE (ARMC ONLY)
Amphetamines, Ur Screen: NOT DETECTED
Barbiturates, Ur Screen: NOT DETECTED
Benzodiazepine, Ur Scrn: NOT DETECTED
CANNABINOID 50 NG, UR ~~LOC~~: NOT DETECTED
Cocaine Metabolite,Ur ~~LOC~~: NOT DETECTED
MDMA (Ecstasy)Ur Screen: NOT DETECTED
Methadone Scn, Ur: NOT DETECTED
Opiate, Ur Screen: NOT DETECTED
Phencyclidine (PCP) Ur S: NOT DETECTED
Tricyclic, Ur Screen: NOT DETECTED

## 2018-10-29 LAB — PREGNANCY, URINE: Preg Test, Ur: NEGATIVE

## 2018-10-29 MED ORDER — FOSFOMYCIN TROMETHAMINE 3 G PO PACK
3.0000 g | PACK | Freq: Once | ORAL | Status: AC
  Administered 2018-10-29: 3 g via ORAL
  Filled 2018-10-29: qty 3

## 2018-10-29 MED ORDER — ONDANSETRON 4 MG PO TBDP: 4.0000 mg | ORAL_TABLET | Freq: Three times a day (TID) | ORAL | 0 refills | Status: DC | PRN

## 2018-10-29 MED ORDER — HYDROCODONE-ACETAMINOPHEN 5-325 MG PO TABS: 1.0000 | ORAL_TABLET | Freq: Four times a day (QID) | ORAL | 0 refills | Status: DC | PRN

## 2018-10-29 NOTE — ED Notes (Signed)
Pt in bathroom retching. Pt denied need for assistance.

## 2018-10-29 NOTE — ED Provider Notes (Signed)
-----------------------------------------   1:09 AM on 10/29/2018 -----------------------------------------  Updated patient on CT scan results.  Will proceed with pelvic ultrasound.   ----------------------------------------- 2:58 AM on 10/29/2018 -----------------------------------------  Pelvic ultrasound interpreted per Dr. Jeannine Boga:  1. Bilateral cystic adnexal lesions as above, left greater than  right, most characteristic for hydrosalpinges. While these are most  likely benign in nature, a short interval follow-up ultrasound in  6-12 weeks to ensure stability and/or resolution recommended.  2. No evidence for ovarian torsion or other abnormality.   Updated patient on ultrasound results.  Will discharge home on analgesia, antiemetic and patient will follow-up closely with GYN.  Strict return precautions given.  Patient verbalized understanding and agrees with plan of care.   Paulette Blanch, MD 10/29/18 [phone]

## 2019-02-24 DIAGNOSIS — B182 Chronic viral hepatitis C: Secondary | ICD-10-CM | POA: Insufficient documentation

## 2019-02-24 DIAGNOSIS — B1920 Unspecified viral hepatitis C without hepatic coma: Secondary | ICD-10-CM | POA: Insufficient documentation

## 2019-02-24 DIAGNOSIS — N7011 Chronic salpingitis: Secondary | ICD-10-CM | POA: Insufficient documentation

## 2019-03-22 ENCOUNTER — Encounter: Payer: Self-pay | Admitting: Emergency Medicine

## 2019-03-22 ENCOUNTER — Emergency Department: Payer: Self-pay

## 2019-03-22 ENCOUNTER — Other Ambulatory Visit: Payer: Self-pay

## 2019-03-22 ENCOUNTER — Inpatient Hospital Stay
Admission: EM | Admit: 2019-03-22 | Discharge: 2019-03-26 | DRG: 433 | Disposition: A | Discharge status: Home / Self Care | Payer: Self-pay | Attending: Specialist | Admitting: Specialist

## 2019-03-22 DIAGNOSIS — K7200 Acute and subacute hepatic failure without coma: Secondary | ICD-10-CM

## 2019-03-22 DIAGNOSIS — K7031 Alcoholic cirrhosis of liver with ascites: Secondary | ICD-10-CM | POA: Diagnosis present

## 2019-03-22 DIAGNOSIS — E871 Hypo-osmolality and hyponatremia: Secondary | ICD-10-CM | POA: Diagnosis present

## 2019-03-22 DIAGNOSIS — I878 Other specified disorders of veins: Secondary | ICD-10-CM | POA: Diagnosis present

## 2019-03-22 DIAGNOSIS — Z20828 Contact with and (suspected) exposure to other viral communicable diseases: Secondary | ICD-10-CM | POA: Diagnosis present

## 2019-03-22 DIAGNOSIS — F10220 Alcohol dependence with intoxication, uncomplicated: Secondary | ICD-10-CM | POA: Diagnosis present

## 2019-03-22 DIAGNOSIS — Z85820 Personal history of malignant melanoma of skin: Secondary | ICD-10-CM

## 2019-03-22 DIAGNOSIS — R1010 Upper abdominal pain, unspecified: Secondary | ICD-10-CM

## 2019-03-22 DIAGNOSIS — E7220 Disorder of urea cycle metabolism, unspecified: Secondary | ICD-10-CM

## 2019-03-22 DIAGNOSIS — B1920 Unspecified viral hepatitis C without hepatic coma: Secondary | ICD-10-CM | POA: Diagnosis present

## 2019-03-22 DIAGNOSIS — E876 Hypokalemia: Secondary | ICD-10-CM

## 2019-03-22 DIAGNOSIS — Y909 Presence of alcohol in blood, level not specified: Secondary | ICD-10-CM | POA: Diagnosis present

## 2019-03-22 DIAGNOSIS — K7460 Unspecified cirrhosis of liver: Secondary | ICD-10-CM

## 2019-03-22 DIAGNOSIS — K7011 Alcoholic hepatitis with ascites: Principal | ICD-10-CM | POA: Diagnosis present

## 2019-03-22 DIAGNOSIS — I10 Essential (primary) hypertension: Secondary | ICD-10-CM | POA: Diagnosis present

## 2019-03-22 DIAGNOSIS — B961 Klebsiella pneumoniae [K. pneumoniae] as the cause of diseases classified elsewhere: Secondary | ICD-10-CM | POA: Diagnosis present

## 2019-03-22 DIAGNOSIS — K7010 Alcoholic hepatitis without ascites: Secondary | ICD-10-CM | POA: Diagnosis present

## 2019-03-22 DIAGNOSIS — Z882 Allergy status to sulfonamides status: Secondary | ICD-10-CM

## 2019-03-22 DIAGNOSIS — F329 Major depressive disorder, single episode, unspecified: Secondary | ICD-10-CM | POA: Diagnosis present

## 2019-03-22 DIAGNOSIS — R188 Other ascites: Secondary | ICD-10-CM

## 2019-03-22 DIAGNOSIS — F1020 Alcohol dependence, uncomplicated: Secondary | ICD-10-CM | POA: Diagnosis present

## 2019-03-22 DIAGNOSIS — Z79899 Other long term (current) drug therapy: Secondary | ICD-10-CM

## 2019-03-22 DIAGNOSIS — F10920 Alcohol use, unspecified with intoxication, uncomplicated: Secondary | ICD-10-CM

## 2019-03-22 DIAGNOSIS — J45909 Unspecified asthma, uncomplicated: Secondary | ICD-10-CM | POA: Diagnosis present

## 2019-03-22 DIAGNOSIS — N390 Urinary tract infection, site not specified: Secondary | ICD-10-CM

## 2019-03-22 DIAGNOSIS — K766 Portal hypertension: Secondary | ICD-10-CM | POA: Diagnosis present

## 2019-03-22 DIAGNOSIS — F17210 Nicotine dependence, cigarettes, uncomplicated: Secondary | ICD-10-CM | POA: Diagnosis present

## 2019-03-22 HISTORY — DX: Alcohol dependence, uncomplicated: F10.20

## 2019-03-22 HISTORY — DX: Other intervertebral disc degeneration, lumbosacral region without mention of lumbar back pain or lower extremity pain: M51.379

## 2019-03-22 HISTORY — DX: Unspecified viral hepatitis C without hepatic coma: B19.20

## 2019-03-22 HISTORY — DX: Other intervertebral disc degeneration, lumbosacral region: M51.37

## 2019-03-22 HISTORY — DX: Malignant (primary) neoplasm, unspecified: C80.1

## 2019-03-22 HISTORY — DX: Unspecified asthma, uncomplicated: J45.909

## 2019-03-22 LAB — COMPREHENSIVE METABOLIC PANEL
ALT: 59 U/L — ABNORMAL HIGH (ref 0–44)
AST: 262 U/L — ABNORMAL HIGH (ref 15–41)
Albumin: 2.6 g/dL — ABNORMAL LOW (ref 3.5–5.0)
Alkaline Phosphatase: 240 U/L — ABNORMAL HIGH (ref 38–126)
Anion gap: 13 (ref 5–15)
BUN: 7 mg/dL (ref 6–20)
CO2: 26 mmol/L (ref 22–32)
Calcium: 10.5 mg/dL — ABNORMAL HIGH (ref 8.9–10.3)
Chloride: 89 mmol/L — ABNORMAL LOW (ref 98–111)
Creatinine, Ser: 0.78 mg/dL (ref 0.44–1.00)
GFR calc Af Amer: >60 mL/min (ref >60)
GFR calc non Af Amer: >60 mL/min (ref >60)
Glucose, Bld: 138 mg/dL — ABNORMAL HIGH (ref 70–99)
Potassium: <2 mmol/L — CL (ref 3.5–5.1)
Sodium: 128 mmol/L — ABNORMAL LOW (ref 135–145)
Total Bilirubin: 3.3 mg/dL — ABNORMAL HIGH (ref 0.3–1.2)
Total Protein: 6.3 g/dL — ABNORMAL LOW (ref 6.5–8.1)

## 2019-03-22 LAB — URINALYSIS, COMPLETE (UACMP) WITH MICROSCOPIC
Glucose, UA: NEGATIVE mg/dL
Ketones, ur: NEGATIVE mg/dL
Nitrite: NEGATIVE
Protein, ur: 30 mg/dL — AB
Specific Gravity, Urine: 1.017 (ref 1.005–1.030)
WBC, UA: >50 WBC/hpf — ABNORMAL HIGH (ref 0–5)
pH: 5 (ref 5.0–8.0)

## 2019-03-22 LAB — CBC
HCT: 34.1 % — ABNORMAL LOW (ref 36.0–46.0)
Hemoglobin: 12.3 g/dL (ref 12.0–15.0)
MCH: 39.3 pg — ABNORMAL HIGH (ref 26.0–34.0)
MCHC: 36.1 g/dL — ABNORMAL HIGH (ref 30.0–36.0)
MCV: 108.9 fL — ABNORMAL HIGH (ref 80.0–100.0)
Platelets: 249 10*3/uL (ref 150–400)
RBC: 3.13 MIL/uL — ABNORMAL LOW (ref 3.87–5.11)
RDW: 16.5 % — ABNORMAL HIGH (ref 11.5–15.5)
WBC: 21 10*3/uL — ABNORMAL HIGH (ref 4.0–10.5)
nRBC: 0 % (ref 0.0–0.2)

## 2019-03-22 LAB — AMMONIA: Ammonia: 73 umol/L — ABNORMAL HIGH (ref 9–35)

## 2019-03-22 LAB — ETHANOL: Alcohol, Ethyl (B): 230 mg/dL — ABNORMAL HIGH (ref <10)

## 2019-03-22 LAB — LIPASE, BLOOD: Lipase: 71 U/L — ABNORMAL HIGH (ref 11–51)

## 2019-03-22 LAB — POCT PREGNANCY, URINE: Preg Test, Ur: NEGATIVE

## 2019-03-22 MED ORDER — LACTULOSE 10 GM/15ML PO SOLN
30.0000 g | Freq: Once | ORAL | Status: AC
  Administered 2019-03-23: 30 g via ORAL
  Filled 2019-03-22: qty 60

## 2019-03-22 MED ORDER — LORAZEPAM 2 MG/ML IJ SOLN
0.0000 mg | Freq: Four times a day (QID) | INTRAMUSCULAR | Status: DC
  Administered 2019-03-22: 2 mg via INTRAVENOUS
  Filled 2019-03-22: qty 1

## 2019-03-22 MED ORDER — POTASSIUM CHLORIDE 10 MEQ/100ML IV SOLN
10.0000 meq | Freq: Once | INTRAVENOUS | Status: AC
  Administered 2019-03-23: 10 meq via INTRAVENOUS
  Filled 2019-03-22: qty 100

## 2019-03-22 MED ORDER — THIAMINE HCL 100 MG/ML IJ SOLN
Freq: Once | INTRAVENOUS | Status: AC
  Administered 2019-03-22: via INTRAVENOUS
  Filled 2019-03-22: qty 1000

## 2019-03-22 MED ORDER — THIAMINE HCL 100 MG/ML IJ SOLN
100.0000 mg | Freq: Every day | INTRAMUSCULAR | Status: DC
  Administered 2019-03-23 – 2019-03-26 (×4): 100 mg via INTRAVENOUS
  Filled 2019-03-22 (×4): qty 2

## 2019-03-22 MED ORDER — PIPERACILLIN-TAZOBACTAM 3.375 G IVPB 30 MIN
3.3750 g | Freq: Once | INTRAVENOUS | Status: AC
  Administered 2019-03-22: 3.375 g via INTRAVENOUS
  Filled 2019-03-22: qty 50

## 2019-03-22 MED ORDER — SODIUM CHLORIDE 0.9% FLUSH: 3.0000 mL | Freq: Once | INTRAVENOUS | Status: DC

## 2019-03-22 MED ORDER — POTASSIUM CHLORIDE 10 MEQ/100ML IV SOLN
10.0000 meq | Freq: Once | INTRAVENOUS | Status: AC
  Administered 2019-03-22: 10 meq via INTRAVENOUS
  Filled 2019-03-22: qty 100

## 2019-03-22 NOTE — ED Provider Notes (Signed)
[facility] Emergency Department Provider Note   ____________________________________________   None    (approximate)  I have reviewed the triage vital signs and the nursing notes.   HISTORY  Chief Complaint Abdominal Pain, Emesis, and Nausea    HPI Abigail Burton is a 43 y.o. female brought to the ED via EMS from home with a chief complaint of upper abdominal pain, nausea and vomiting.  Patient reports symptoms for the past 2 months.  History of alcohol dependency and hepatitis C.  Does admit to drinking 1/5 liquor daily; last drink prior to arrival.  Denies fever, cough, chest pain, shortness of breath, dysuria, diarrhea.  Denies recent travel, trauma or exposure to persons diagnosed with coronavirus.       Past Medical History:  Diagnosis Date  . Alcohol dependence ([place])   . Asthma   . Cancer ([place])    left shoulder melonoma with excision  . DDD (degenerative disc disease), lumbosacral   . Hepatitis C   . Hypertension     Patient Active Problem List   Diagnosis Date Noted  . Alcoholic hepatitis without ascites 03/23/2019  . UTI (urinary tract infection) 03/23/2019  . HTN (hypertension) 03/23/2019  . Hypokalemia 03/23/2019  . Alcoholic hepatitis 84/16/6063  . Chronic hepatitis C without hepatic coma ([place]) 02/24/2019  . Hydrosalpinx 02/24/2019  . Tobacco use disorder 02/26/2018  . Oral contraceptive use 08/03/2017  . Depression 04/12/2017  . Substance induced mood disorder ([place]) 09/28/2016  . 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 History of malignant melanoma of skin 01/30/2013  . Anxiety disorder 12/27/2012  . Alcohol withdrawal ([place]) 11/21/2012  . Alcohol dependence ([place]) 09/08/2010    Past Surgical History:  Procedure Laterality Date  . cancer removal      Prior to Admission medications   Medication Sig Start Date End Date Taking? Authorizing Provider  albuterol (PROVENTIL HFA;VENTOLIN HFA)  108 (90 Base) MCG/ACT inhaler Inhale 2 puffs into the lungs every 6 (six) hours as needed for wheezing or shortness of breath. Patient not taking: Reported on 10/28/2018 07/15/18   Lisa Roca, MD  buPROPion (WELLBUTRIN XL) 300 MG 24 hr tablet Take 300 mg by mouth daily. 05/31/18   [provider]  gabapentin (NEURONTIN) 300 MG capsule Take 900 mg by mouth 3 (three) times daily.  02/23/17   [provider]  HYDROcodone-acetaminophen (NORCO) 5-325 MG tablet Take 1 tablet by mouth every 6 (six) hours as needed for moderate pain. 10/29/18   Paulette Blanch, MD  ondansetron (ZOFRAN ODT) 4 MG disintegrating tablet Take 1 tablet (4 mg total) by mouth every 8 (eight) hours as needed for nausea or vomiting. 10/29/18   Paulette Blanch, MD  potassium chloride (K-DUR,KLOR-CON) 10 MEQ tablet Take 1 tablet (10 mEq total) by mouth 2 (two) times daily. 10/28/18   Harvest Dark, MD  sertraline (ZOLOFT) 50 MG tablet Take 100 mg by mouth daily. 05/31/18   [provider]    Allergies Sulfa antibiotics  History reviewed. No pertinent family history.  Social History Social History   Tobacco Use  . Smoking status: Current Every Day Smoker    Packs/day: 0.50    Types: Cigarettes  . Smokeless tobacco: Never Used  Substance Use Topics  . Alcohol use: Yes    Comment: "half a fifth per day"  . Drug use: No    Review of Systems  Constitutional: No fever/chills Eyes: No visual changes. ENT: No sore throat. Cardiovascular: Denies chest  pain. Respiratory: Denies shortness of breath. Gastrointestinal: Positive for abdominal pain, nausea and vomiting.  No diarrhea.  No constipation. Genitourinary: Negative for dysuria. Musculoskeletal: Negative for back pain. Skin: Negative for rash. Neurological: Negative for headaches, focal weakness or numbness.   ____________________________________________   PHYSICAL EXAM:  VITAL SIGNS: ED Triage Vitals [03/22/19 2049]  Enc Vitals Group      BP 120/81     Pulse Rate (!) 123     Resp 20     Temp 98.3 F (36.8 C)     Temp Source Oral     SpO2 97 %     Weight 122 lb (55.3 kg)     Height 5\' 2"  (1.575 m)     Head Circumference      Peak Flow      Pain Score 10     Pain Loc      Pain Edu?      Excl. in [place]?     Constitutional: Alert and oriented.  Chronically ill appearing and in mild acute distress. Eyes: Conjunctivae are normal. PERRL. EOMI. Head: Atraumatic. Nose: No congestion/rhinnorhea. Mouth/Throat: Mucous membranes are mildly dry.  Oropharynx non-erythematous. Neck: No stridor.   Cardiovascular: Normal rate, regular rhythm. Grossly normal heart sounds.  Good peripheral circulation. Respiratory: Normal respiratory effort.  No retractions. Lungs CTAB. Gastrointestinal: Soft and mildly tender to palpation upper abdomen without rebound or guarding.  Hepatomegaly.  Mild to moderate distention secondary to generalized ascites. No abdominal bruits. No CVA tenderness. Musculoskeletal: No lower extremity tenderness nor edema.  No joint effusions. Neurologic:  Normal speech and language. No gross focal neurologic deficits are appreciated.  Skin:  Skin is warm, dry and intact. No rash noted. Psychiatric: Mood and affect are normal. Speech and behavior are normal.  ____________________________________________   LABS (all labs ordered are listed, but only abnormal results are displayed)  Labs Reviewed  LIPASE, BLOOD - Abnormal; Notable for the following components:      Result Value   Lipase 71 (*)    All other components within normal limits  COMPREHENSIVE METABOLIC PANEL - Abnormal; Notable for the following components:   Sodium 128 (*)    Potassium <2.0 (*)    Chloride 89 (*)    Glucose, Bld 138 (*)    Calcium 10.5 (*)    Total Protein 6.3 (*)    Albumin 2.6 (*)    AST 262 (*)    ALT 59 (*)    Alkaline Phosphatase 240 (*)    Total Bilirubin 3.3 (*)    All other components within normal limits  CBC - Abnormal;  Notable for the following components:   WBC 21.0 (*)    RBC 3.13 (*)    HCT 34.1 (*)    MCV 108.9 (*)    MCH 39.3 (*)    MCHC 36.1 (*)    RDW 16.5 (*)    All other components within normal limits  URINALYSIS, COMPLETE (UACMP) WITH MICROSCOPIC - Abnormal; Notable for the following components:   Color, Urine AMBER (*)    APPearance CLOUDY (*)    Hgb urine dipstick SMALL (*)    Bilirubin Urine SMALL (*)    Protein, ur 30 (*)    Leukocytes,Ua MODERATE (*)    WBC, UA >50 (*)    Bacteria, UA MANY (*)    All other components within normal limits  AMMONIA - Abnormal; Notable for the following components:   Ammonia 73 (*)    All other components  within normal limits  ETHANOL - Abnormal; Notable for the following components:   Alcohol, Ethyl (B) 230 (*)    All other components within normal limits  LACTIC ACID, PLASMA - Abnormal; Notable for the following components:   Lactic Acid, Venous 4.7 (*)    All other components within normal limits  LACTIC ACID, PLASMA - Abnormal; Notable for the following components:   Lactic Acid, Venous 3.5 (*)    All other components within normal limits  PROTIME-INR - Abnormal; Notable for the following components:   Prothrombin Time 18.6 (*)    INR 1.6 (*)    All other components within normal limits  POTASSIUM - Abnormal; Notable for the following components:   Potassium 2.4 (*)    All other components within normal limits  SARS CORONAVIRUS 2 (HOSPITAL ORDER, [place] [facility])  CULTURE, BLOOD (ROUTINE X 2)  CULTURE, BLOOD (ROUTINE X 2)  URINE CULTURE  HEPATITIS PANEL, ACUTE  HIV ANTIBODY (ROUTINE TESTING W REFLEX)  CBC  CREATININE, SERUM  BASIC METABOLIC PANEL  CBC  POC URINE PREG, ED  POCT PREGNANCY, URINE   ____________________________________________  EKG  ED ECG REPORT I, SUNG,JADE J, the attending physician, personally viewed and interpreted this ECG.   Date: 03/22/2019  EKG Time: 2113  Rate: 109  Rhythm:  sinus tachycardia  Axis: Normal  Intervals:QTC 544  ST&T Change: Nonspecific  ____________________________________________  RADIOLOGY  ED MD interpretation: No acute cardiopulmonary process  Official radiology report(s): Dg Chest Port 1 View  Result Date: 03/23/2019 CLINICAL DATA:  Pain EXAM: PORTABLE CHEST 1 VIEW COMPARISON:  10/28/2018 FINDINGS: The heart size and mediastinal contours are within normal limits. Both lungs are clear. The visualized skeletal structures are unremarkable. Postsurgical changes are noted in the left axilla. IMPRESSION: No active disease. Electronically Signed   By: Inez Catalina M.D.   On: 03/23/2019 00:01    ____________________________________________   PROCEDURES  Procedure(s) performed (including Critical Care):  Procedures  CRITICAL CARE Performed by: Paulette Blanch   Total critical care time: 30 minutes  Critical care time was exclusive of separately billable procedures and treating other patients.  Critical care was necessary to treat or prevent imminent or life-threatening deterioration.  Critical care was time spent personally by me on the following activities: development of treatment plan with patient and/or surrogate as well as nursing, discussions with consultants, evaluation of patient's response to treatment, examination of patient, obtaining history from patient or surrogate, ordering and performing treatments and interventions, ordering and review of laboratory studies, ordering and review of radiographic studies, pulse oximetry and re-evaluation of patient's condition. ____________________________________________   INITIAL IMPRESSION / ASSESSMENT AND PLAN / ED COURSE  As part of my medical decision making, I reviewed the following data within the [place] notes reviewed and incorporated, Labs reviewed, EKG interpreted, Old chart reviewed, Radiograph reviewed, Discussed with admitting physician Dr. Jannifer Franklin  and Notes from prior ED visits     Abigail Burton was evaluated in Emergency Department on 03/23/2019 for the symptoms described in the history of present illness. She was evaluated in the context of the global COVID-19 pandemic, which necessitated consideration that the patient might be at risk for infection with the SARS-CoV-2 virus that causes COVID-19. Institutional protocols and algorithms that pertain to the evaluation of patients at risk for COVID-19 are in a state of rapid change based on information released by regulatory bodies including the CDC and federal and state organizations. These policies and  algorithms were followed during the patient's care in the ED.   43 year old female with hepatitis and alcohol abuse who presents with upper abdominal pain, nausea and vomiting. Differential diagnosis includes, but is not limited to, biliary disease (biliary colic, acute cholecystitis, cholangitis, choledocholithiasis, etc), intrathoracic causes for epigastric abdominal pain including ACS, gastritis, duodenitis, pancreatitis, small bowel or large bowel obstruction, abdominal aortic aneurysm, hernia, and ulcer(s).  Laboratory results notable for leukocytosis, severe hypokalemia, hyponatremia, elevated transaminases and bilirubin, elevated lipase, ammonia and EtOH; UA remarkable for UTI.  Will check blood cultures, lactic acid.  Placed on CIWA scale.  Administer IV Zosyn for broad-spectrum coverage, fluids, IV potassium.  Will discuss with hospitalist to evaluate patient in the emergency department for admission.  Low suspicion for SBP.  Patient states she has never required paracentesis in the past.   Clinical Course as of Mar 23 311  Sat Mar 23, 2019  0050 Discussed case with hospitalist Dr. Jannifer Franklin who will evaluate patient in the emergency department for admission.   [JS]    Clinical Course User Index [JS] Paulette Blanch, MD     ____________________________________________   FINAL  CLINICAL IMPRESSION(S) / ED DIAGNOSES  Final diagnoses:  Acute liver failure without hepatic coma  Pain of upper abdomen  Alcoholic intoxication without complication ([place])  Hyperammonemia (HCC)  Hypokalemia  Urinary tract infection without hematuria, site unspecified     ED Discharge Orders    None       Note:  This document was prepared using Dragon voice recognition software and may include unintentional dictation errors.   Paulette Blanch, MD 03/23/19 [phone]

## 2019-03-22 NOTE — ED Triage Notes (Signed)
Patient presents to Emergency Department via [facility] from HOME with complaints of abdominal pain ("it's hurting all over" - pt indicates left and right outer to inner and lower abdomen), pt also c/o N/V for the last 2 months and reports unable to eat or drink anything with it "coming back up" - pt is drinking Gatorade without emesis.    Pt reports hx of "cysts and inflamed fallopian tubes, heartburn, and depression"  Pt reports drinking 0.5 of a fifth of liquor daily and reports drinking "fireball" before calling EMS.   Pt ambulatory but stumbling.  Abdomen appears distended.     History of DDD, and Hep C

## 2019-03-23 ENCOUNTER — Other Ambulatory Visit: Payer: Self-pay

## 2019-03-23 ENCOUNTER — Inpatient Hospital Stay: Payer: Self-pay

## 2019-03-23 DIAGNOSIS — I10 Essential (primary) hypertension: Secondary | ICD-10-CM | POA: Diagnosis present

## 2019-03-23 DIAGNOSIS — N390 Urinary tract infection, site not specified: Secondary | ICD-10-CM | POA: Diagnosis present

## 2019-03-23 DIAGNOSIS — K7010 Alcoholic hepatitis without ascites: Secondary | ICD-10-CM | POA: Diagnosis present

## 2019-03-23 DIAGNOSIS — E876 Hypokalemia: Secondary | ICD-10-CM | POA: Diagnosis present

## 2019-03-23 LAB — POTASSIUM
Potassium: 2.4 mmol/L — CL (ref 3.5–5.1)
Potassium: 2.9 mmol/L — ABNORMAL LOW (ref 3.5–5.1)

## 2019-03-23 LAB — SARS CORONAVIRUS 2 BY RT PCR (HOSPITAL ORDER, PERFORMED IN ~~LOC~~ HOSPITAL LAB): SARS Coronavirus 2: NEGATIVE

## 2019-03-23 LAB — BASIC METABOLIC PANEL
Anion gap: 11 (ref 5–15)
BUN: 6 mg/dL (ref 6–20)
CO2: 22 mmol/L (ref 22–32)
Calcium: 9.4 mg/dL (ref 8.9–10.3)
Chloride: 96 mmol/L — ABNORMAL LOW (ref 98–111)
Creatinine, Ser: 0.61 mg/dL (ref 0.44–1.00)
GFR calc Af Amer: >60 mL/min (ref >60)
GFR calc non Af Amer: >60 mL/min (ref >60)
Glucose, Bld: 113 mg/dL — ABNORMAL HIGH (ref 70–99)
Potassium: 2.3 mmol/L — CL (ref 3.5–5.1)
Sodium: 129 mmol/L — ABNORMAL LOW (ref 135–145)

## 2019-03-23 LAB — CBC
HCT: 30.9 % — ABNORMAL LOW (ref 36.0–46.0)
Hemoglobin: 11.1 g/dL — ABNORMAL LOW (ref 12.0–15.0)
MCH: 39.6 pg — ABNORMAL HIGH (ref 26.0–34.0)
MCHC: 35.9 g/dL (ref 30.0–36.0)
MCV: 110.4 fL — ABNORMAL HIGH (ref 80.0–100.0)
Platelets: 209 10*3/uL (ref 150–400)
RBC: 2.8 MIL/uL — ABNORMAL LOW (ref 3.87–5.11)
RDW: 16.5 % — ABNORMAL HIGH (ref 11.5–15.5)
WBC: 15.9 10*3/uL — ABNORMAL HIGH (ref 4.0–10.5)
nRBC: 0 % (ref 0.0–0.2)

## 2019-03-23 LAB — PROTIME-INR
INR: 1.6 — ABNORMAL HIGH (ref 0.8–1.2)
Prothrombin Time: 18.6 seconds — ABNORMAL HIGH (ref 11.4–15.2)

## 2019-03-23 LAB — LACTIC ACID, PLASMA
Lactic Acid, Venous: 4.7 mmol/L (ref 0.5–1.9)
Lactic Acid, Venous: 3.5 mmol/L (ref 0.5–1.9)
Lactic Acid, Venous: 2.3 mmol/L (ref 0.5–1.9)

## 2019-03-23 LAB — MAGNESIUM
Magnesium: 1.5 mg/dL — ABNORMAL LOW (ref 1.7–2.4)
Magnesium: 2.5 mg/dL — ABNORMAL HIGH (ref 1.7–2.4)

## 2019-03-23 LAB — APTT: aPTT: 36 seconds (ref 24–36)

## 2019-03-23 MED ORDER — SODIUM CHLORIDE 0.9 % IV BOLUS
1000.0000 mL | Freq: Once | INTRAVENOUS | Status: AC
  Administered 2019-03-23: 05:00:00 1000 mL via INTRAVENOUS

## 2019-03-23 MED ORDER — FOLIC ACID 1 MG PO TABS
1.0000 mg | ORAL_TABLET | Freq: Every day | ORAL | Status: DC
  Administered 2019-03-23 – 2019-03-26 (×4): 1 mg via ORAL
  Filled 2019-03-23 (×4): qty 1

## 2019-03-23 MED ORDER — HEPARIN BOLUS VIA INFUSION
3400.0000 [IU] | Freq: Once | INTRAVENOUS | Status: AC
  Administered 2019-03-23: 20:00:00 3400 [IU] via INTRAVENOUS
  Filled 2019-03-23: qty 3400

## 2019-03-23 MED ORDER — ONDANSETRON HCL 4 MG PO TABS: 4.0000 mg | ORAL_TABLET | Freq: Four times a day (QID) | ORAL | Status: DC | PRN

## 2019-03-23 MED ORDER — ADULT MULTIVITAMIN W/MINERALS CH
1.0000 | ORAL_TABLET | Freq: Every day | ORAL | Status: DC
  Administered 2019-03-23 – 2019-03-26 (×4): 1 via ORAL
  Filled 2019-03-23 (×4): qty 1

## 2019-03-23 MED ORDER — SODIUM CHLORIDE 0.9 % IV SOLN
1.0000 g | INTRAVENOUS | Status: DC
  Administered 2019-03-23 – 2019-03-26 (×4): 1 g via INTRAVENOUS
  Filled 2019-03-23 (×2): qty 1
  Filled 2019-03-23: qty 10
  Filled 2019-03-23 (×2): qty 1

## 2019-03-23 MED ORDER — POTASSIUM CHLORIDE CRYS ER 20 MEQ PO TBCR
40.0000 meq | EXTENDED_RELEASE_TABLET | ORAL | Status: DC
  Administered 2019-03-23: 07:00:00 40 meq via ORAL
  Filled 2019-03-23: qty 2

## 2019-03-23 MED ORDER — LORAZEPAM 2 MG/ML IJ SOLN: 0.0000 mg | Freq: Four times a day (QID) | INTRAMUSCULAR | Status: DC

## 2019-03-23 MED ORDER — LORAZEPAM 2 MG/ML IJ SOLN: 0.0000 mg | Freq: Two times a day (BID) | INTRAMUSCULAR | Status: DC

## 2019-03-23 MED ORDER — HEPARIN (PORCINE) 25000 UT/250ML-% IV SOLN
950.0000 [IU]/h | INTRAVENOUS | Status: DC
  Administered 2019-03-23: 20:00:00 900 [IU]/h via INTRAVENOUS
  Filled 2019-03-23: qty 250

## 2019-03-23 MED ORDER — MAGNESIUM SULFATE 4 GM/100ML IV SOLN
4.0000 g | Freq: Once | INTRAVENOUS | Status: AC
  Administered 2019-03-23: 4 g via INTRAVENOUS
  Filled 2019-03-23: qty 100

## 2019-03-23 MED ORDER — LORAZEPAM 2 MG/ML IJ SOLN: 1.0000 mg | Freq: Four times a day (QID) | INTRAMUSCULAR | Status: AC | PRN

## 2019-03-23 MED ORDER — POTASSIUM CHLORIDE 10 MEQ/100ML IV SOLN
10.0000 meq | INTRAVENOUS | Status: AC
  Administered 2019-03-23 (×2): 10 meq via INTRAVENOUS
  Filled 2019-03-23 (×2): qty 100

## 2019-03-23 MED ORDER — ONDANSETRON HCL 4 MG/2ML IJ SOLN: 4.0000 mg | Freq: Four times a day (QID) | INTRAMUSCULAR | Status: DC | PRN

## 2019-03-23 MED ORDER — MORPHINE SULFATE (PF) 2 MG/ML IV SOLN
2.0000 mg | INTRAVENOUS | Status: DC | PRN
  Administered 2019-03-23 – 2019-03-26 (×10): 2 mg via INTRAVENOUS
  Filled 2019-03-23 (×10): qty 1

## 2019-03-23 MED ORDER — IBUPROFEN 400 MG PO TABS
400.0000 mg | ORAL_TABLET | Freq: Four times a day (QID) | ORAL | Status: DC | PRN
  Administered 2019-03-23: 400 mg via ORAL
  Filled 2019-03-23: qty 1

## 2019-03-23 MED ORDER — LORAZEPAM 1 MG PO TABS
1.0000 mg | ORAL_TABLET | Freq: Four times a day (QID) | ORAL | Status: AC | PRN
  Administered 2019-03-25: 03:00:00 1 mg via ORAL
  Filled 2019-03-23: qty 1

## 2019-03-23 MED ORDER — POTASSIUM CHLORIDE CRYS ER 20 MEQ PO TBCR
20.0000 meq | EXTENDED_RELEASE_TABLET | Freq: Three times a day (TID) | ORAL | Status: DC
  Administered 2019-03-23 – 2019-03-24 (×5): 20 meq via ORAL
  Filled 2019-03-23 (×5): qty 1

## 2019-03-23 MED ORDER — ENOXAPARIN SODIUM 40 MG/0.4ML ~~LOC~~ SOLN
40.0000 mg | SUBCUTANEOUS | Status: DC
  Filled 2019-03-23: qty 0.4

## 2019-03-23 MED ORDER — ALUM & MAG HYDROXIDE-SIMETH 200-200-20 MG/5ML PO SUSP
30.0000 mL | Freq: Four times a day (QID) | ORAL | Status: DC | PRN
  Administered 2019-03-23 – 2019-03-26 (×8): 30 mL via ORAL
  Filled 2019-03-23 (×8): qty 30

## 2019-03-23 MED ORDER — POTASSIUM CHLORIDE IN NACL 20-0.9 MEQ/L-% IV SOLN
INTRAVENOUS | Status: DC
  Administered 2019-03-23 – 2019-03-25 (×4): via INTRAVENOUS
  Filled 2019-03-23 (×5): qty 1000

## 2019-03-23 NOTE — Progress Notes (Signed)
[facility] [place] NAME: Adina Puzzo    MR#:  [id number]  DATE OF BIRTH:  01-31-1976  SUBJECTIVE:   Patient still complaining of some abdominal pain and some nausea.  Admitted for alcoholic hepatitis/severe hypokalemia.  Potassium level improved with supplementation.  REVIEW OF SYSTEMS:    Review of Systems  Constitutional: Negative for chills and fever.  HENT: Negative for congestion and tinnitus.   Eyes: Negative for blurred vision and double vision.  Respiratory: Negative for cough, shortness of breath and wheezing.   Cardiovascular: Negative for chest pain, orthopnea and PND.  Gastrointestinal: Positive for abdominal pain and nausea. Negative for diarrhea and vomiting.  Genitourinary: Negative for dysuria and hematuria.  Neurological: Negative for dizziness, sensory change and focal weakness.  All other systems reviewed and are negative.   Nutrition: Heart Healthy: Tolerating Diet: Yes Tolerating PT: Await Eval.   DRUG ALLERGIES:   Allergies  Allergen Reactions  . Sulfa Antibiotics     VITALS:  Blood pressure 91/75, pulse 97, temperature 97.8 F (36.6 C), temperature source Oral, resp. rate 16, height 5' (1.524 m), weight 58 kg, last menstrual period 02/06/2019, SpO2 96 %.  PHYSICAL EXAMINATION:   Physical Exam  GENERAL:  43 y.o.-year-old patient lying in bed in no acute distress.  EYES: Pupils equal, round, reactive to light and accommodation. No scleral icterus. Extraocular muscles intact.  HEENT: Head atraumatic, normocephalic. Oropharynx and nasopharynx clear.  NECK:  Supple, no jugular venous distention. No thyroid enlargement, no tenderness.  LUNGS: Normal breath sounds bilaterally, no wheezing, rales, rhonchi. No use of accessory muscles of respiration.  CARDIOVASCULAR: S1, S2 normal. No murmurs, rubs, or gallops.  ABDOMEN: Soft, Tender diffusely, No rebound, rigidity, Distended, + fluid wave. Bowel sounds  present. No organomegaly or mass.  EXTREMITIES: No cyanosis, clubbing or edema b/l.    NEUROLOGIC: Cranial nerves II through XII are intact. No focal Motor or sensory deficits b/l.   PSYCHIATRIC: The patient is alert and oriented x 3.  SKIN: No obvious rash, lesion, or ulcer.    LABORATORY PANEL:   CBC Recent Labs  Lab 03/23/19 0443  WBC 15.9*  HGB 11.1*  HCT 30.9*  PLT 209   ------------------------------------------------------------------------------------------------------------------  Chemistries  Recent Labs  Lab 03/22/19 2107  03/23/19 0443  NA 128*  --  129*  K <2.0*   < > 2.3*  CL 89*  --  96*  CO2 26  --  22  GLUCOSE 138*  --  113*  BUN 7  --  6  CREATININE 0.78  --  0.61  CALCIUM 10.5*  --  9.4  MG  --   --  1.5*  AST 262*  --   --   ALT 59*  --   --   ALKPHOS 240*  --   --   BILITOT 3.3*  --   --    < > = values in this interval not displayed.   ------------------------------------------------------------------------------------------------------------------  Cardiac Enzymes No results for input(s): TROPONINI in the last 168 hours. ------------------------------------------------------------------------------------------------------------------  RADIOLOGY:  Dg Chest Port 1 View  Result Date: 03/23/2019 CLINICAL DATA:  Pain EXAM: PORTABLE CHEST 1 VIEW COMPARISON:  10/28/2018 FINDINGS: The heart size and mediastinal contours are within normal limits. Both lungs are clear. The visualized skeletal structures are unremarkable. Postsurgical changes are noted in the left axilla. IMPRESSION: No active disease. Electronically Signed   By: Inez Catalina M.D.   On: 03/23/2019 00:01  ASSESSMENT AND PLAN:   43 year old female with past medical history of alcohol abuse, depression who presents to the hospital due to abdominal pain nausea and vomiting.  1.  Abdominal pain nausea-secondary to alcoholic hepatitis.   - Continue supportive care with pain control,  IV fluids. -Patient's abdomen somewhat distended, questionable ascites given her history of alcohol abuse. -I will get a limited right upper quadrant ultrasound.  2.  Hypokalemia- secondary to the nausea vomiting and alcohol abuse. -Continue supplement aggressively with IV and oral potassium.  Repeat level in the morning. -Patient's magnesium level was low and was replaced this morning.  3.  Urinary tract infection-based off a urinalysis on admission. -Continue IV ceftriaxone, follow urine cultures.    4. hyponatremia- mild, asymptomatic. -Secondary to alcohol abuse.  Continue IV fluids, follow sodium.  5.  Leukocytosis-suspected to be reactive/secondary to underlying UTI.  Continue IV ceftriaxone, follow white cell count.  6.  Alcohol abuse-patient is at high risk for going through alcohol withdrawal. - Continue CIWA protocol, continue thiamine, folate.   All the records are reviewed and case discussed with Care Management/Social Worker. Management plans discussed with the patient, family and they are in agreement.  CODE STATUS: Full code  DVT Prophylaxis: Lovenox  TOTAL TIME TAKING CARE OF THIS PATIENT: 30 minutes.   POSSIBLE D/C IN 2-3 DAYS, DEPENDING ON CLINICAL CONDITION.   Henreitta Leber M.D on 03/23/2019 at 12:17 PM  Between 7am to 6pm - Pager - [phone]  After 6pm go to [email or web address] - Proofreader  [facility]  Office  [phone]  CC: Primary care physician; Patient, No Pcp Per

## 2019-03-23 NOTE — Progress Notes (Addendum)
On-call radiologist Dr. Thornton Papas called it abnormal right upper quadrant limited ultrasound results.  Ultrasound has revealed acute portal vein thrombosis We will consult vascular surgery stat and start the patient on heparin drip as I do not see any contraindications at this point of time. I spoke to Dr. Lorenso Courier who agrees with heparin drip and recommending to transfer the patient to tertiary care center.  I spoke to the patient and explained the results of the ultrasound, severity of the situation ,patient understands the diagnosis and wants to try heparin drip and IV pain medication at this point of time and not willing to be transferred to tertiary care center at this point of time.  If there is no improvement overnight she will consider getting transferred to a tertiary care center in a.m.   Discussed with RN Shirlean Mylar , she has provided information about portal vein thrombosis to the patient  Addendum: Spoke with the patient face-to-face and explained the diagnosis again and she has received education and material from the RNs regarding portal vein thrombosis.  Patient wants to try IV morphine and IV heparin drip tonight and will consider getting transferred to [facility] in a.m. if she does not feel better.  She also spoke to her mom [name], I will try to call and speak to mom

## 2019-03-23 NOTE — Consult Note (Signed)
ANTICOAGULATION CONSULT NOTE - Follow Up Consult  Pharmacy Consult for Heparin dosing  Indication: acute portal vein thrombosis  Allergies  Allergen Reactions  . Sulfa Antibiotics     Patient Measurements: Height: 5' (152.4 cm) Weight: 127 lb 12.8 oz (58 kg) IBW/kg (Calculated) : 45.5 Heparin Dosing Weight: 57.2 kg   Vital Signs:    Labs: Recent Labs    03/22/19 2107 03/22/19 2349 03/23/19 0443  HGB 12.3  --  11.1*  HCT 34.1*  --  30.9*  PLT 249  --  209  LABPROT  --  18.6*  --   INR  --  1.6*  --   CREATININE 0.78  --  0.61    Estimated Creatinine Clearance: 72.3 mL/min (by C-G formula based on SCr of 0.61 mg/dL).   Medications:  No PTA anticoagulants Enoxaparin was ordered yet not given   Assessment: Patient has a PMH significant for alcoholic hepatitis without ascites and pharmacy was consulted for acute portal vein thrombosis. Attending discussed with patient about transferring to an outside hospital for a higher level of care - possibly [facility] pending the patient's status.    Goal of Therapy:  Heparin level 0.3-0.7 units/ml Monitor platelets by anticoagulation protocol: Yes   Plan:  Baseline labs have been ordered  Heparin DW: 57.2 kg  Give 3400 units bolus x 1 Start heparin infusion at 900 units/hr Check anti-Xa level in 6 hours and CBC daily while on heparin, per protocol. Continue to monitor H&H and platelets   Harout Scheurich R Kord Monette 03/23/2019,6:57 PM

## 2019-03-23 NOTE — ED Notes (Signed)
Mother has been updated on pt's status.

## 2019-03-23 NOTE — ED Notes (Signed)
MD Beather Arbour made aware of critical lactic acid level.

## 2019-03-23 NOTE — Progress Notes (Signed)
Notified MD of critical potassium result.

## 2019-03-23 NOTE — H&P (Signed)
[facility] NAME: Abigail Burton    MR#:  [id number]  DATE OF BIRTH:  [birth year]  DATE OF ADMISSION:  03/22/2019  PRIMARY CARE PHYSICIAN: Patient, No Pcp Per   REQUESTING/REFERRING PHYSICIAN: Beather Arbour, MD  CHIEF COMPLAINT:   Chief Complaint  Patient presents with  . Abdominal Pain  . Emesis  . Nausea    HISTORY OF PRESENT ILLNESS:  Abigail Burton  is a 43 y.o. female who presents with chief complaint as above.  Patient brought to the ED with complaint of abdominal pain and nausea with vomiting.  She states that she has been having this frequently lately.  She also states that she has been drinking half 1/5 of liquor every day.  She had fair amount of alcohol prior to coming here tonight.  On evaluation she has electrolyte derangements, including profound hypokalemia.  She also has an elevated lactic acid, transaminitis, likely UTI.  Hospitalist were called for admission  PAST MEDICAL HISTORY:   Past Medical History:  Diagnosis Date  . Alcohol dependence ([place])   . Asthma   . Cancer ([place])    left shoulder melonoma with excision  . DDD (degenerative disc disease), lumbosacral   . Hepatitis C   . Hypertension      PAST SURGICAL HISTORY:   Past Surgical History:  Procedure Laterality Date  . cancer removal       SOCIAL HISTORY:   Social History   Tobacco Use  . Smoking status: Current Every Day Smoker    Packs/day: 0.50    Types: Cigarettes  . Smokeless tobacco: Never Used  Substance Use Topics  . Alcohol use: Yes    Comment: "half a fifth per day"     FAMILY HISTORY:    Family history reviewed and is non-contributory DRUG ALLERGIES:   Allergies  Allergen Reactions  . Sulfa Antibiotics     MEDICATIONS AT HOME:   Prior to Admission medications   Medication Sig Start Date End Date Taking? Authorizing Provider  albuterol (PROVENTIL HFA;VENTOLIN HFA) 108 (90 Base) MCG/ACT inhaler Inhale 2 puffs  into the lungs every 6 (six) hours as needed for wheezing or shortness of breath. Patient not taking: Reported on 10/28/2018 07/15/18   Lisa Roca, MD  buPROPion (WELLBUTRIN XL) 300 MG 24 hr tablet Take 300 mg by mouth daily. 05/31/18   [provider]  gabapentin (NEURONTIN) 300 MG capsule Take 900 mg by mouth 3 (three) times daily.  02/23/17   [provider]  HYDROcodone-acetaminophen (NORCO) 5-325 MG tablet Take 1 tablet by mouth every 6 (six) hours as needed for moderate pain. 10/29/18   Paulette Blanch, MD  ondansetron (ZOFRAN ODT) 4 MG disintegrating tablet Take 1 tablet (4 mg total) by mouth every 8 (eight) hours as needed for nausea or vomiting. 10/29/18   Paulette Blanch, MD  potassium chloride (K-DUR,KLOR-CON) 10 MEQ tablet Take 1 tablet (10 mEq total) by mouth 2 (two) times daily. 10/28/18   Harvest Dark, MD  sertraline (ZOLOFT) 50 MG tablet Take 100 mg by mouth daily. 05/31/18   [provider]    REVIEW OF SYSTEMS:  Review of Systems  Constitutional: Negative for chills, fever, malaise/fatigue and weight loss.  HENT: Negative for ear pain, hearing loss and tinnitus.   Eyes: Negative for blurred vision, double vision, pain and redness.  Respiratory: Negative for cough, hemoptysis and shortness of breath.   Cardiovascular: Negative for chest pain, palpitations, orthopnea and  leg swelling.  Gastrointestinal: Positive for abdominal pain, nausea and vomiting. Negative for constipation and diarrhea.  Genitourinary: Negative for dysuria, frequency and hematuria.  Musculoskeletal: Negative for back pain, joint pain and neck pain.  Skin:       No acne, rash, or lesions  Neurological: Negative for dizziness, tremors, focal weakness and weakness.  Endo/Heme/Allergies: Negative for polydipsia. Does not bruise/bleed easily.  Psychiatric/Behavioral: Negative for depression. The patient is not nervous/anxious and does not have insomnia.      VITAL SIGNS:   Vitals:    03/22/19 2049 03/22/19 2213 03/22/19 2306 03/22/19 2339  BP: 120/81 112/86 (!) 142/99 (!) 142/99  Pulse: (!) 123  (!) 103 (!) 106  Resp: 20 15 (!) 29   Temp: 98.3 F (36.8 C)     TempSrc: Oral     SpO2: 97%  100%   Weight: 55.3 kg     Height: 5\' 2"  (1.575 m)      Wt Readings from Last 3 Encounters:  03/22/19 55.3 kg  10/28/18 57.2 kg  07/15/18 60 kg    PHYSICAL EXAMINATION:  Physical Exam  Vitals reviewed. Constitutional: She appears well-developed and well-nourished. No distress.  HENT:  Head: Normocephalic and atraumatic.  Mouth/Throat: Oropharynx is clear and moist.  Eyes: Pupils are equal, round, and reactive to light. Conjunctivae and EOM are normal. No scleral icterus.  Neck: Normal range of motion. Neck supple. No JVD present. No thyromegaly present.  Cardiovascular: Regular rhythm and intact distal pulses. Exam reveals no gallop and no friction rub.  No murmur heard. Tachycardic  Respiratory: Effort normal and breath sounds normal. No respiratory distress. She has no wheezes. She has no rales.  GI: Soft. Bowel sounds are normal. She exhibits no distension. There is abdominal tenderness.  Musculoskeletal: Normal range of motion.        General: No edema.     Comments: No arthritis, no gout  Lymphadenopathy:    She has no cervical adenopathy.  Neurological: She is alert. No cranial nerve deficit.  No dysarthria, no aphasia  Skin: Skin is warm and dry. No rash noted. No erythema.  Psychiatric:  Difficult to assess due to patient condition    LABORATORY PANEL:   CBC Recent Labs  Lab 03/22/19 2107  WBC 21.0*  HGB 12.3  HCT 34.1*  PLT 249   ------------------------------------------------------------------------------------------------------------------  Chemistries  Recent Labs  Lab 03/22/19 2107  NA 128*  K <2.0*  CL 89*  CO2 26  GLUCOSE 138*  BUN 7  CREATININE 0.78  CALCIUM 10.5*  AST 262*  ALT 59*  ALKPHOS 240*  BILITOT 3.3*    ------------------------------------------------------------------------------------------------------------------  Cardiac Enzymes No results for input(s): TROPONINI in the last 168 hours. ------------------------------------------------------------------------------------------------------------------  RADIOLOGY:  Dg Chest Port 1 View  Result Date: 03/23/2019 CLINICAL DATA:  Pain EXAM: PORTABLE CHEST 1 VIEW COMPARISON:  10/28/2018 FINDINGS: The heart size and mediastinal contours are within normal limits. Both lungs are clear. The visualized skeletal structures are unremarkable. Postsurgical changes are noted in the left axilla. IMPRESSION: No active disease. Electronically Signed   By: Inez Catalina M.D.   On: 03/23/2019 00:01    EKG:   Orders placed or performed during the hospital encounter of 03/22/19  . ED EKG  . ED EKG    IMPRESSION AND PLAN:  Principal Problem:   Alcoholic hepatitis without ascites -we will monitor her transaminases closely.  Recheck with morning labs.  I have also sent a hepatitis panel.  I expect him  to improve.  If they do not improve or they get worse she will need GI consult. Active Problems:   UTI (urinary tract infection) -IV antibiotics started, urine culture ordered   Hypokalemia -IV potassium ordered for replacement, recheck level and continue to replace until within normal limits   Alcohol dependence ([place]) -CIWA protocol   HTN (hypertension) -home dose antihypertensives  Chart review performed and case discussed with ED provider. Labs, imaging and/or ECG reviewed by provider and discussed with patient/family. Management plans discussed with the patient and/or family.  COVID-19 status: Negative  DVT PROPHYLAXIS: SubQ lovenox   GI PROPHYLAXIS:  None  ADMISSION STATUS: Inpatient     CODE STATUS: Full  TOTAL TIME TAKING CARE OF THIS PATIENT: 45 minutes.   This patient was evaluated in the context of the global COVID-19 pandemic, which  necessitated consideration that the patient might be at risk for infection with the SARS-CoV-2 virus that causes COVID-19. Institutional protocols and algorithms that pertain to the evaluation of patients at risk for COVID-19 are in a state of rapid change based on information released by regulatory bodies including the CDC and federal and state organizations. These policies and algorithms were followed to the best of this provider's knowledge to date during the patient's care at this facility.  Ethlyn Daniels 03/23/2019, 12:47 AM  [facility]  Office  [phone]  CC: Primary care physician; Patient, No Pcp Per  Note:  This document was prepared using Dragon voice recognition software and may include unintentional dictation errors.

## 2019-03-23 NOTE — ED Notes (Signed)
ED TO INPATIENT HANDOFF REPORT  ED Nurse Name and Phone #: Wells Guiles [phone]  S Name/Age/Gender Abigail Burton 43 y.o. female Room/Bed: ED02A/ED02A  Code Status   Code Status: Not on file  Home/SNF/Other Home Patient oriented to: self, place, time and situation Is this baseline? Yes   Triage Complete: Triage complete  Chief Complaint EMS Abd pain  Triage Note Patient presents to Emergency Department via [facility] from HOME with complaints of abdominal pain ("it's hurting all over" - pt indicates left and right outer to inner and lower abdomen), pt also c/o N/V for the last 2 months and reports unable to eat or drink anything with it "coming back up" - pt is drinking Gatorade without emesis.    Pt reports hx of "cysts and inflamed fallopian tubes, heartburn, and depression"  Pt reports drinking 0.5 of a fifth of liquor daily and reports drinking "fireball" before calling EMS.   Pt ambulatory but stumbling.  Abdomen appears distended.     History of DDD, and Hep C     Allergies Allergies  Allergen Reactions  . Sulfa Antibiotics     Level of Care/Admitting Diagnosis ED Disposition    ED Disposition Condition [facility]: [place] [[id number]]  Level of Care: Med-Surg [16]  Covid Evaluation: Asymptomatic Screening Protocol (No Symptoms)  Diagnosis: Alcoholic hepatitis [481856]  Admitting Physician: Lance Coon [[id number]]  Attending Physician: Lance Coon [phone]  Estimated length of stay: past midnight tomorrow  Certification:: I certify this patient will need inpatient services for at least 2 midnights  PT Class (Do Not Modify): Inpatient [101]  PT Acc Code (Do Not Modify): Private [1]       B Medical/Surgery History Past Medical History:  Diagnosis Date  . Alcohol dependence ([place])   . Asthma   . Cancer ([place])    left shoulder melonoma with excision  . DDD (degenerative disc disease), lumbosacral   . Hepatitis  C   . Hypertension    Past Surgical History:  Procedure Laterality Date  . cancer removal       A IV Location/Drains/Wounds Patient Lines/Drains/Airways Status   Active Line/Drains/Airways    Name:   Placement date:   Placement time:   Site:   Days:   Peripheral IV 03/22/19 Right Antecubital   03/22/19    2230    Antecubital   1   Peripheral IV 03/22/19 Left Hand   03/22/19    2353    Hand   1          Intake/Output Last 24 hours No intake or output data in the 24 hours ending 03/23/19 0125  Labs/Imaging Results for orders placed or performed during the hospital encounter of 03/22/19 (from the past 48 hour(s))  Lipase, blood     Status: Abnormal   Collection Time: 03/22/19  9:07 PM  Result Value Ref Range   Lipase 71 (H) 11 - 51 U/L    Comment: Performed at [facility], [address] [place], [place] [address]  Comprehensive metabolic panel     Status: Abnormal   Collection Time: 03/22/19  9:07 PM  Result Value Ref Range   Sodium 128 (L) 135 - 145 mmol/L   Potassium <2.0 (LL) 3.5 - 5.1 mmol/L    Comment: CRITICAL RESULT CALLED TO, READ BACK BY AND VERIFIED WITH ANN CALES RN AT 2155 03/22/2019.MSS    Chloride 89 (L) 98 - 111 mmol/L   CO2 26 22 -  32 mmol/L   Glucose, Bld 138 (H) 70 - 99 mg/dL   BUN 7 6 - 20 mg/dL   Creatinine, Ser 0.78 0.44 - 1.00 mg/dL   Calcium 10.5 (H) 8.9 - 10.3 mg/dL   Total Protein 6.3 (L) 6.5 - 8.1 g/dL   Albumin 2.6 (L) 3.5 - 5.0 g/dL   AST 262 (H) 15 - 41 U/L   ALT 59 (H) 0 - 44 U/L   Alkaline Phosphatase 240 (H) 38 - 126 U/L   Total Bilirubin 3.3 (H) 0.3 - 1.2 mg/dL   GFR calc non Af Amer >60 >60 mL/min   GFR calc Af Amer >60 >60 mL/min   Anion gap 13 5 - 15    Comment: Performed at [facility], [address] [place], [place] [address]  CBC     Status: Abnormal   Collection Time: 03/22/19  9:07 PM  Result Value Ref Range   WBC 21.0 (H) 4.0 - 10.5 K/uL   RBC 3.13 (L) 3.87 - 5.11 MIL/uL   Hemoglobin 12.3 12.0 -  15.0 g/dL   HCT 34.1 (L) 36.0 - 46.0 %   MCV 108.9 (H) 80.0 - 100.0 fL   MCH 39.3 (H) 26.0 - 34.0 pg   MCHC 36.1 (H) 30.0 - 36.0 g/dL   RDW 16.5 (H) 11.5 - 15.5 %   Platelets 249 150 - 400 K/uL   nRBC 0.0 0.0 - 0.2 %    Comment: Performed at [facility], [address] [place], [place] [address]  Urinalysis, Complete w Microscopic     Status: Abnormal   Collection Time: 03/22/19  9:07 PM  Result Value Ref Range   Color, Urine AMBER (A) YELLOW    Comment: BIOCHEMICALS MAY BE AFFECTED BY COLOR   APPearance CLOUDY (A) CLEAR   Specific Gravity, Urine 1.017 1.005 - 1.030   pH 5.0 5.0 - 8.0   Glucose, UA NEGATIVE NEGATIVE mg/dL   Hgb urine dipstick SMALL (A) NEGATIVE   Bilirubin Urine SMALL (A) NEGATIVE   Ketones, ur NEGATIVE NEGATIVE mg/dL   Protein, ur 30 (A) NEGATIVE mg/dL   Nitrite NEGATIVE NEGATIVE   Leukocytes,Ua MODERATE (A) NEGATIVE   RBC / HPF 0-5 0 - 5 RBC/hpf   WBC, UA >50 (H) 0 - 5 WBC/hpf   Bacteria, UA MANY (A) NONE SEEN   Squamous Epithelial / LPF 11-20 0 - 5   Mucus PRESENT    Hyaline Casts, UA PRESENT     Comment: Performed at [facility], [address] [place], [place] [address]  Ammonia     Status: Abnormal   Collection Time: 03/22/19  9:07 PM  Result Value Ref Range   Ammonia 73 (H) 9 - 35 umol/L    Comment: Performed at [facility], [address] [place], [place] [address]  Ethanol     Status: Abnormal   Collection Time: 03/22/19  9:07 PM  Result Value Ref Range   Alcohol, Ethyl (B) 230 (H) <10 mg/dL    Comment: (NOTE) Lowest detectable limit for serum alcohol is 10 mg/dL. For medical purposes only. Performed at [facility], [address] [place], [place] [address]   Pregnancy, urine POC     Status: None   Collection Time: 03/22/19  9:11 PM  Result Value Ref Range   Preg Test, Ur NEGATIVE NEGATIVE    Comment:        THE SENSITIVITY OF THIS METHODOLOGY IS >24 mIU/mL   Lactic acid,  plasma     Status:  Abnormal   Collection Time: 03/22/19 10:31 PM  Result Value Ref Range   Lactic Acid, Venous 4.7 (HH) 0.5 - 1.9 mmol/L    Comment: CRITICAL RESULT CALLED TO, READ BACK BY AND VERIFIED WITH Sueellen Kayes @041  03/23/2019 TTG Performed at [facility], [address], [place], [place] [address]   SARS Coronavirus 2 (CEPHEID - Performed in [place] hospital lab), Hosp Order     Status: None   Collection Time: 03/22/19 11:49 PM   Specimen: Nasopharyngeal Swab  Result Value Ref Range   SARS Coronavirus 2 NEGATIVE NEGATIVE    Comment: (NOTE) If result is NEGATIVE SARS-CoV-2 target nucleic acids are NOT DETECTED. The SARS-CoV-2 RNA is generally detectable in upper and lower  respiratory specimens during the acute phase of infection. The lowest  concentration of SARS-CoV-2 viral copies this assay can detect is 250  copies / mL. A negative result does not preclude SARS-CoV-2 infection  and should not be used as the sole basis for treatment or other  patient management decisions.  A negative result may occur with  improper specimen collection / handling, submission of specimen other  than nasopharyngeal swab, presence of viral mutation(s) within the  areas targeted by this assay, and inadequate number of viral copies  (<250 copies / mL). A negative result must be combined with clinical  observations, patient history, and epidemiological information. If result is POSITIVE SARS-CoV-2 target nucleic acids are DETECTED. The SARS-CoV-2 RNA is generally detectable in upper and lower  respiratory specimens dur ing the acute phase of infection.  Positive  results are indicative of active infection with SARS-CoV-2.  Clinical  correlation with patient history and other diagnostic information is  necessary to determine patient infection status.  Positive results do  not rule out bacterial infection or co-infection with other viruses. If result is PRESUMPTIVE POSTIVE SARS-CoV-2 nucleic acids  MAY BE PRESENT.   A presumptive positive result was obtained on the submitted specimen  and confirmed on repeat testing.  While 2019 novel coronavirus  (SARS-CoV-2) nucleic acids may be present in the submitted sample  additional confirmatory testing may be necessary for epidemiological  and / or clinical management purposes  to differentiate between  SARS-CoV-2 and other Sarbecovirus currently known to infect humans.  If clinically indicated additional testing with an alternate test  methodology [phone]) is advised. The SARS-CoV-2 RNA is generally  detectable in upper and lower respiratory sp ecimens during the acute  phase of infection. The expected result is Negative. Fact Sheet for Patients:  StrictlyIdeas.no Fact Sheet for Healthcare Providers: BankingDealers.co.za This test is not yet approved or cleared by the [place] FDA and has been authorized for detection and/or diagnosis of SARS-CoV-2 by FDA under an Emergency Use Authorization (EUA).  This EUA will remain in effect (meaning this test can be used) for the duration of the COVID-19 declaration under Section 564(b)(1) of the Act, 21 U.S.C. section 360bbb-3(b)(1), unless the authorization is terminated or revoked sooner. Performed at [facility], [address] [place], [place] [address]   Protime-INR     Status: Abnormal   Collection Time: 03/22/19 11:49 PM  Result Value Ref Range   Prothrombin Time 18.6 (H) 11.4 - 15.2 seconds   INR 1.6 (H) 0.8 - 1.2    Comment: (NOTE) INR goal varies based on device and disease states. Performed at [facility], [address] [place], [place] [address]    Dg Chest  Port 1 View  Result Date: 03/23/2019 CLINICAL DATA:  Pain EXAM: PORTABLE CHEST 1 VIEW COMPARISON:  10/28/2018 FINDINGS: The heart size and mediastinal contours are within normal limits. Both lungs are clear. The visualized skeletal structures are  unremarkable. Postsurgical changes are noted in the left axilla. IMPRESSION: No active disease. Electronically Signed   By: Inez Catalina M.D.   On: 03/23/2019 00:01    Pending Labs Unresulted Labs (From admission, onward)    Start     Ordered   03/22/19 2317  Culture, blood (routine x 2)  BLOOD CULTURE X 2,   STAT     03/22/19 2317   03/22/19 2317  Lactic acid, plasma  Now then every 2 hours,   STAT     03/22/19 2317   Signed and Held  HIV antibody (Routine Testing)  Once,   R     Signed and Held   Signed and Held  CBC  (enoxaparin (LOVENOX)    CrCl >/= 30 ml/min)  Once,   R    Comments: Baseline for enoxaparin therapy IF NOT ALREADY DRAWN.  Notify MD if PLT < 100 K.    Signed and Held   Signed and Held  Creatinine, serum  (enoxaparin (LOVENOX)    CrCl >/= 30 ml/min)  Once,   R    Comments: Baseline for enoxaparin therapy IF NOT ALREADY DRAWN.    Signed and Held   Signed and Held  Creatinine, serum  (enoxaparin (LOVENOX)    CrCl >/= 30 ml/min)  Weekly,   R    Comments: while on enoxaparin therapy    Signed and Held   Signed and Held  Basic metabolic panel  Tomorrow morning,   R     Signed and Held   Signed and Held  CBC  Tomorrow morning,   R     Signed and Held          Vitals/Pain Today's Vitals   03/22/19 2213 03/22/19 2306 03/22/19 2339 03/23/19 0030  BP: 112/86 (!) 142/99 (!) 142/99 (!) 114/94  Pulse:  (!) 103 (!) 106 (!) 110  Resp: 15 (!) 29  (!) 24  Temp:      TempSrc:      SpO2:  100%  98%  Weight:      Height:      PainSc:        Isolation Precautions No active isolations  Medications Medications  sodium chloride flush (NS) 0.9 % injection 3 mL (3 mLs Intravenous Not Given 03/22/19 2206)  thiamine (B-1) injection 100 mg (has no administration in time range)  LORazepam (ATIVAN) injection 0-4 mg (2 mg Intravenous Given 03/22/19 2341)  potassium chloride 10 mEq in 100 mL IVPB (10 mEq Intravenous New Bag/Given 03/23/19 0119)  potassium chloride 10 mEq in 100  mL IVPB (has no administration in time range)  potassium chloride 10 mEq in 100 mL IVPB (has no administration in time range)  sodium chloride 0.9 % 1,000 mL with thiamine 759 mg, folic acid 1 mg, multivitamins adult 10 mL infusion ( Intravenous Stopped 03/23/19 0119)  potassium chloride 10 mEq in 100 mL IVPB (0 mEq Intravenous Stopped 03/23/19 0118)  piperacillin-tazobactam (ZOSYN) IVPB 3.375 g (0 g Intravenous Stopped 03/23/19 0052)  lactulose (CHRONULAC) 10 GM/15ML solution 30 g (30 g Oral Given 03/23/19 0003)    Mobility walks Low fall risk   Focused Assessments Low K+   R Recommendations: See Admitting Provider Note  Report given to:  Additional Notes:

## 2019-03-23 NOTE — Progress Notes (Addendum)
Dr. Margaretmary Eddy in to speak to patient about transfer to a higher level of care. Pt agrees to transfer to [facility] in the am if no improvement. Dr. Margaretmary Eddy to call to speak with patient's family.

## 2019-03-24 ENCOUNTER — Inpatient Hospital Stay: Payer: Self-pay

## 2019-03-24 LAB — CBC
HCT: 29.4 % — ABNORMAL LOW (ref 36.0–46.0)
Hemoglobin: 10.3 g/dL — ABNORMAL LOW (ref 12.0–15.0)
MCH: 39.6 pg — ABNORMAL HIGH (ref 26.0–34.0)
MCHC: 35 g/dL (ref 30.0–36.0)
MCV: 113.1 fL — ABNORMAL HIGH (ref 80.0–100.0)
Platelets: 180 10*3/uL (ref 150–400)
RBC: 2.6 MIL/uL — ABNORMAL LOW (ref 3.87–5.11)
RDW: 17.5 % — ABNORMAL HIGH (ref 11.5–15.5)
WBC: 13.3 10*3/uL — ABNORMAL HIGH (ref 4.0–10.5)
nRBC: 0 % (ref 0.0–0.2)

## 2019-03-24 LAB — COMPREHENSIVE METABOLIC PANEL
ALT: 49 U/L — ABNORMAL HIGH (ref 0–44)
AST: 218 U/L — ABNORMAL HIGH (ref 15–41)
Albumin: 1.9 g/dL — ABNORMAL LOW (ref 3.5–5.0)
Alkaline Phosphatase: 186 U/L — ABNORMAL HIGH (ref 38–126)
Anion gap: 5 (ref 5–15)
BUN: 8 mg/dL (ref 6–20)
CO2: 24 mmol/L (ref 22–32)
Calcium: 8.2 mg/dL — ABNORMAL LOW (ref 8.9–10.3)
Chloride: 105 mmol/L (ref 98–111)
Creatinine, Ser: 0.68 mg/dL (ref 0.44–1.00)
GFR calc Af Amer: >60 mL/min (ref >60)
GFR calc non Af Amer: >60 mL/min (ref >60)
Glucose, Bld: 101 mg/dL — ABNORMAL HIGH (ref 70–99)
Potassium: 3.4 mmol/L — ABNORMAL LOW (ref 3.5–5.1)
Sodium: 134 mmol/L — ABNORMAL LOW (ref 135–145)
Total Bilirubin: 2.6 mg/dL — ABNORMAL HIGH (ref 0.3–1.2)
Total Protein: 5.1 g/dL — ABNORMAL LOW (ref 6.5–8.1)

## 2019-03-24 LAB — LACTIC ACID, PLASMA: Lactic Acid, Venous: 1.2 mmol/L (ref 0.5–1.9)

## 2019-03-24 LAB — HEPARIN LEVEL (UNFRACTIONATED)
Heparin Unfractionated: 0.31 IU/mL (ref 0.30–0.70)
Heparin Unfractionated: 0.32 IU/mL (ref 0.30–0.70)
Heparin Unfractionated: <0.1 IU/mL — ABNORMAL LOW (ref 0.30–0.70)

## 2019-03-24 LAB — AMMONIA: Ammonia: 71 umol/L — ABNORMAL HIGH (ref 9–35)

## 2019-03-24 MED ORDER — IOHEXOL 300 MG/ML  SOLN: 15.0000 mL | INTRAMUSCULAR | Status: DC

## 2019-03-24 MED ORDER — IOHEXOL 300 MG/ML  SOLN
15.0000 mL | INTRAMUSCULAR | Status: AC
  Administered 2019-03-24 (×2): 15 mL via ORAL

## 2019-03-24 MED ORDER — NICOTINE 21 MG/24HR TD PT24
21.0000 mg | MEDICATED_PATCH | Freq: Every day | TRANSDERMAL | Status: DC
  Administered 2019-03-24 – 2019-03-25 (×2): 21 mg via TRANSDERMAL
  Filled 2019-03-24 (×2): qty 1

## 2019-03-24 MED ORDER — IOHEXOL 300 MG/ML  SOLN
100.0000 mL | Freq: Once | INTRAMUSCULAR | Status: AC | PRN
  Administered 2019-03-24: 19:00:00 100 mL via INTRAVENOUS

## 2019-03-24 NOTE — Consult Note (Signed)
ANTICOAGULATION CONSULT NOTE - Follow Up Consult  Pharmacy Consult for Heparin dosing  Indication: acute portal vein thrombosis  Allergies  Allergen Reactions  . Sulfa Antibiotics     Patient Measurements: Height: 5' (152.4 cm) Weight: 127 lb 12.8 oz (58 kg) IBW/kg (Calculated) : 45.5 Heparin Dosing Weight: 57.2 kg   Vital Signs: Temp: 98.6 F (37 C) (07/25 2025) Temp Source: Oral (07/25 2025) BP: 108/74 (07/25 2025) Pulse Rate: 115 (07/25 2025)  Labs: Recent Labs    03/22/19 2107 03/22/19 2349 03/23/19 0443 03/23/19 1908 03/24/19 0025  HGB 12.3  --  11.1*  --   --   HCT 34.1*  --  30.9*  --   --   PLT 249  --  209  --   --   APTT  --   --   --  36  --   LABPROT  --  18.6*  --   --   --   INR  --  1.6*  --   --   --   HEPARINUNFRC  --   --   --   --  0.32  CREATININE 0.78  --  0.61  --   --     Estimated Creatinine Clearance: 72.3 mL/min (by C-G formula based on SCr of 0.61 mg/dL).   Medications:  No PTA anticoagulants Enoxaparin was ordered yet not given   Assessment: Patient has a PMH significant for alcoholic hepatitis without ascites and pharmacy was consulted for acute portal vein thrombosis. Attending discussed with patient about transferring to an outside hospital for a higher level of care - possibly [facility] pending the patient's status.   7/25 Heparin infusion started @ 900 units/hr.  7/26 @ 0025 HL: 0.32.    Goal of Therapy:  Heparin level 0.3-0.7 units/ml Monitor platelets by anticoagulation protocol: Yes   Plan:  7/26 @ 0025 HL: 0.32. Level is therapeutic x1. Will continue current heparin infusion rate of 900 units/hr.  Recheck confirmatory level in 6 hours.   Check anti-Xa and CBC daily while on heparin, per protocol. Continue to monitor H&H and platelets  Pernell Dupre, PharmD, BCPS Clinical Pharmacist 03/24/2019 1:00 AM

## 2019-03-24 NOTE — Progress Notes (Signed)
[facility] [place] NAME: Abigail Burton    MR#:  [id number]  DATE OF BIRTH:  10/25/1975  SUBJECTIVE:   Patient still complaining of some abdominal pain and distention.  Patient's right upper quadrant ultrasound yesterday showed evidence of portal vein thrombosis.  No other acute events overnight.  REVIEW OF SYSTEMS:    Review of Systems  Constitutional: Negative for chills and fever.  HENT: Negative for congestion and tinnitus.   Eyes: Negative for blurred vision and double vision.  Respiratory: Negative for cough, shortness of breath and wheezing.   Cardiovascular: Negative for chest pain, orthopnea and PND.  Gastrointestinal: Positive for abdominal pain and nausea. Negative for diarrhea and vomiting.  Genitourinary: Negative for dysuria and hematuria.  Neurological: Negative for dizziness, sensory change and focal weakness.  All other systems reviewed and are negative.   Nutrition: Heart Healthy Tolerating Diet: Yes Tolerating PT: Await Eval.   DRUG ALLERGIES:   Allergies  Allergen Reactions  . Sulfa Antibiotics     VITALS:  Blood pressure 103/84, pulse 98, temperature 97.9 F (36.6 C), temperature source Oral, resp. rate 16, height 5' (1.524 m), weight 58 kg, last menstrual period 02/06/2019, SpO2 97 %.  PHYSICAL EXAMINATION:   Physical Exam  GENERAL:  43 y.o.-year-old patient lying in bed in no acute distress.  EYES: Pupils equal, round, reactive to light and accommodation. No scleral icterus. Extraocular muscles intact.  HEENT: Head atraumatic, normocephalic. Oropharynx and nasopharynx clear.  NECK:  Supple, no jugular venous distention. No thyroid enlargement, no tenderness.  LUNGS: Normal breath sounds bilaterally, no wheezing, rales, rhonchi. No use of accessory muscles of respiration.  CARDIOVASCULAR: S1, S2 normal. No murmurs, rubs, or gallops.  ABDOMEN: Soft, Tender diffusely, No rebound, rigidity, Distended,  + fluid wave. Bowel sounds present. No organomegaly or mass.  EXTREMITIES: No cyanosis, clubbing or edema b/l.    NEUROLOGIC: Cranial nerves II through XII are intact. No focal Motor or sensory deficits b/l.   PSYCHIATRIC: The patient is alert and oriented x 3.  SKIN: No obvious rash, lesion, or ulcer.    LABORATORY PANEL:   CBC Recent Labs  Lab 03/24/19 0624  WBC 13.3*  HGB 10.3*  HCT 29.4*  PLT 180   ------------------------------------------------------------------------------------------------------------------  Chemistries  Recent Labs  Lab 03/23/19 2018 03/24/19 0624  NA  --  134*  K 2.9* 3.4*  CL  --  105  CO2  --  24  GLUCOSE  --  101*  BUN  --  8  CREATININE  --  0.68  CALCIUM  --  8.2*  MG 2.5*  --   AST  --  218*  ALT  --  49*  ALKPHOS  --  186*  BILITOT  --  2.6*   ------------------------------------------------------------------------------------------------------------------  Cardiac Enzymes No results for input(s): TROPONINI in the last 168 hours. ------------------------------------------------------------------------------------------------------------------  RADIOLOGY:  Dg Chest Port 1 View  Result Date: 03/23/2019 CLINICAL DATA:  Pain EXAM: PORTABLE CHEST 1 VIEW COMPARISON:  10/28/2018 FINDINGS: The heart size and mediastinal contours are within normal limits. Both lungs are clear. The visualized skeletal structures are unremarkable. Postsurgical changes are noted in the left axilla. IMPRESSION: No active disease. Electronically Signed   By: Inez Catalina M.D.   On: 03/23/2019 00:01   US Abdomen Limited Ruq  Result Date: 03/23/2019 CLINICAL DATA:  Cirrhosis EXAM: ULTRASOUND ABDOMEN LIMITED RIGHT UPPER QUADRANT COMPARISON:  CT abdomen and pelvis 10/29/2018 FINDINGS: Gallbladder: Thickened gallbladder  wall nonspecific in the setting of ascites. Sludge within gallbladder. Additional probable tiny shadowing calculi. No sonographic Murphy sign. Common  bile duct: Diameter: 7 mm, minimally prominent for age Liver: Echogenic hepatic parenchyma with minimally nodular hepatic margins consistent with cirrhosis. No focal hepatic mass lesion or intrahepatic biliary dilatation. Portal vein demonstrates internal echogenicity and absent spontaneous flow consistent with portal vein thrombosis. Portal vein was opacified and patent on the prior CT exam. Scattered ascites. IMPRESSION: Sludge and probable tiny calculi within gallbladder with diffuse thickening of the gallbladder wall, a nonspecific finding in the setting of ascites. Slight biliary dilatation, CBD 7 mm diameter, recommend correlation with LFTs. Cirrhotic appearing liver with internal echogenicity and absent spontaneous venous flow within portal vein consistent with portal vein thrombosis. Critical Value/emergent results were called by telephone at the time of interpretation on 03/23/2019 at 6:33 pm to Dr. Margaretmary Eddy, who verbally acknowledged these results. Electronically Signed   By: Lavonia Dana M.D.   On: 03/23/2019 18:33     ASSESSMENT AND PLAN:   43 year old female with past medical history of alcohol abuse, depression who presents to the hospital due to abdominal pain nausea and vomiting.  1.  Abdominal pain/nausea-secondary to alcoholic hepatitis/Liver Cirrhosis/Portal vein thrombosis.    - Continue supportive care with pain control, IV fluids. - will get US guided paracentesis tomorrow.   2. Portal Vein Thrombosis/Cirrhosis - due to alcohol abuse.  - pt's portal vein thrombosis is secondary to venous stasis from underlying cirrhosis.  No acute need for anticoagulation.  Discussed with gastroenterology. -Continue supportive care to treat underlying cirrhosis for now.  Await further GI input.  3.  Hypokalemia/hypomagnesemia- secondary to the nausea vomiting and alcohol abuse. -Improved and resolved with supplementation.  4.  Urinary tract infection-based off a urinalysis on admission. -Continue  IV ceftriaxone, urine cultures are growing 100,000 col of gram (-) rod but await ID and sensitivities.     5. hyponatremia- mild, asymptomatic. -Secondary to alcohol abuse.  Improved and resolved with fluids.   6.  Leukocytosis-suspected to be reactive/secondary to underlying UTI.  Continue IV ceftriaxone, WBC trending down.   7.  Alcohol abuse-patient is at high risk for going through alcohol withdrawal. - Continue CIWA protocol, continue thiamine, folate.    All the records are reviewed and case discussed with Care Management/Social Worker. Management plans discussed with the patient, family and they are in agreement.  CODE STATUS: Full code  DVT Prophylaxis: Lovenox  TOTAL TIME TAKING CARE OF THIS PATIENT: 30 minutes.   POSSIBLE D/C IN 2-3 DAYS, DEPENDING ON CLINICAL CONDITION.   Henreitta Leber M.D on 03/24/2019 at 12:25 PM  Between 7am to 6pm - Pager - [phone]  After 6pm go to [email or web address] - Proofreader  [facility]  Office  [phone]  CC: Primary care physician; Patient, No Pcp Per

## 2019-03-24 NOTE — Progress Notes (Signed)
Patient discussed with GI. OK to discontinue Heparin. No further Vascular Surgery recommendations or interventions at this time. Further care per GI and Medicine. Please reconsult for further questions/concerns.

## 2019-03-24 NOTE — Progress Notes (Signed)
Initial Nutrition Assessment  RD working remotely.  DOCUMENTATION CODES:   Not applicable  INTERVENTION:  Provide Ensure Enlive po BID, each supplement provides 350 kcal and 20 grams of protein. Patient prefers vanilla.  Continue MVI daily, thiamine 664 mg daily, folic acid 1 mg daily.  NUTRITION DIAGNOSIS:   Inadequate oral intake related to decreased appetite as evidenced by per patient/family report.  GOAL:   Patient will meet greater than or equal to 90% of their needs  MONITOR:   PO intake, Supplement acceptance, Labs, Weight trends, I & O's  REASON FOR ASSESSMENT:   Malnutrition Screening Tool    ASSESSMENT:   43 year old female with PMHx of HTN, asthma, hepatitis C, EtOH abuse, depression admitted with abdominal pain and nausea secondary to alcoholic hepatitis/liver cirrhosis/portal vein thrombosis, also with UTI.   Spoke with patient over the phone. She reports her appetite was decreased PTA for about 2 months. She was only eating one meal per day, though that meal was a full meal. She reports she would eat steak with potatoes or spaghetti at that meal. She was also drinking some protein shakes at home but can't remember the name. She reports her appetite is starting to improve now. She is ordering small portions but was able to eat all of her breakfast this morning. She is amenable to drinking ONS to help meet calorie/protein needs.  Patient reports her UBW was 134 lbs and that she had lost down to 122 lbs the last time she was [facility] the [facility]. Per chart she is 58 kg (127.8 lbs). She appears weight-stable per chart.  Medications reviewed and include: folic acid 1 mg daily, Ativan for CIWA, MVI daily, potassium chloride 53mEq PO 3 times today, thiamine 100 mg daily IV, NS with KCl 43mEq/L at 75 mL/hr, ceftriaxone.  Labs reviewed: Sodium 134, Potassium 3.4.  Patient is at risk for malnutrition. Unable to determine if patient meets criteria for malnutrition  without completing NFPE.  NUTRITION - FOCUSED PHYSICAL EXAM:  Unable to complete at this time.  Diet Order:   Diet Order            Diet Heart Room service appropriate? Yes; Fluid consistency: Thin  Diet effective now             EDUCATION NEEDS:   No education needs have been identified at this time  Skin:  Skin Assessment: Reviewed RN Assessment  Last BM:  03/23/2019 - small type 6  Height:   Ht Readings from Last 1 Encounters:  03/23/19 5' (1.524 m)   Weight:   Wt Readings from Last 1 Encounters:  03/23/19 58 kg   Ideal Body Weight:  45.5 kg  BMI:  Body mass index is 24.96 kg/m.  Estimated Nutritional Needs:   Kcal:  1500-1700  Protein:  75-85 grams  Fluid:  1.5-1.7 L/day  Willey Blade, MS, RD, LDN Office: [phone] Pager: [phone] After Hours/Weekend Pager: [phone]

## 2019-03-24 NOTE — Consult Note (Signed)
[facility] GI Inpatient Consult Note   Kathline Magic, M.D.  Reason for Consult: Portal vein thrombosis, cirrhosis, ascites   Attending Requesting Consult: Abel Presto, MD   History of Present Illness: Abigail Burton is a 43 y.o. female with a history of alcoholism and polysubstance abuse presenting for progressively distended abdomen with pain, nausea and vomiting.  Patient appears to be alert but in some discomfort.  Patient underwent transabdominal ultrasound yesterday after hospital admission and found to have isolated portal vein thrombosis with diffuse ascites.  Patient was markedly hypokalemic and this was supplemented.  There is no history of recent gastrointestinal bleeding.  A CT in March per renal protocol was negative for any obvious thrombus at that time. The patient appears to be followed by family medicine at [facility].  Dr. Lorenso Courier of vascular surgery evaluated the patient and advised IV LMWH gtt and transfer to tertiary care facility, particularly if TIPS would be needed.  Past Medical History:  Past Medical History:  Diagnosis Date  . Alcohol dependence ([place])   . Asthma   . Cancer ([place])    left shoulder melonoma with excision  . DDD (degenerative disc disease), lumbosacral   . Hepatitis C   . Hypertension     Problem List: Patient Active Problem List   Diagnosis Date Noted  . Alcoholic hepatitis without ascites 03/23/2019  . UTI (urinary tract infection) 03/23/2019  . HTN (hypertension) 03/23/2019  . Hypokalemia 03/23/2019  . Alcoholic hepatitis 59/74/1638  . Chronic hepatitis C without hepatic coma ([place]) 02/24/2019  . Hydrosalpinx 02/24/2019  . Tobacco use disorder 02/26/2018  . Oral contraceptive use 08/03/2017  . Depression 04/12/2017  . Substance induced mood disorder ([place]) 09/28/2016  . 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 History of malignant melanoma of skin 01/30/2013  . Anxiety disorder 12/27/2012  .  Alcohol withdrawal ([place]) 11/21/2012  . Alcohol dependence ([place]) 09/08/2010    Past Surgical History: Past Surgical History:  Procedure Laterality Date  . cancer removal      Allergies: Allergies  Allergen Reactions  . Sulfa Antibiotics     Home Medications: Medications Prior to Admission  Medication Sig Dispense Refill Last Dose  . buPROPion (WELLBUTRIN XL) 300 MG 24 hr tablet Take 300 mg by mouth daily.  3 Past Month at Unknown time  . gabapentin (NEURONTIN) 300 MG capsule Take 900 mg by mouth 3 (three) times daily.   0 03/22/2019 at Unknown time  . sertraline (ZOLOFT) 50 MG tablet Take 50 mg by mouth 2 (two) times a day.   3 Past Month at Unknown time  . trazodone (DESYREL) 300 MG tablet Take 300 mg by mouth at bedtime as needed for sleep.   Past Month at prn   Home medication reconciliation was completed with the patient.   Scheduled Inpatient Medications:   . folic acid  1 mg Oral Daily  . LORazepam  0-4 mg Intravenous Q6H   Followed by  . [START ON 03/25/2019] LORazepam  0-4 mg Intravenous Q12H  . multivitamin with minerals  1 tablet Oral Daily  . potassium chloride  20 mEq Oral TID  . thiamine  100 mg Intravenous Daily    Continuous Inpatient Infusions:   . 0.9 % NaCl with KCl 20 mEq / L 75 mL/hr at 03/24/19 0740  . cefTRIAXone (ROCEPHIN)  IV Stopped (03/24/19 0303)    PRN Inpatient Medications:  alum & mag hydroxide-simeth, LORazepam **OR** LORazepam, morphine injection, ondansetron **OR** ondansetron (ZOFRAN) IV  Family History: family history is not on file.   GI Family History: Negative  Social History:   reports that she has been smoking cigarettes. She has been smoking about 0.50 packs per day. She has never used smokeless tobacco. She reports current alcohol use. She reports that she does not use drugs. The patient denies ETOH, tobacco, or drug use.    Review of Systems: Review of Systems - General ROS: positive for  - fatigue and weight gain negative  for - fever or night sweats Psychological ROS: positive for - anxiety Ophthalmic ROS: negative ENT ROS: negative Allergy and Immunology ROS: negative Hematological and Lymphatic ROS: negative for - blood transfusions, jaundice, swollen lymph nodes or weight loss Endocrine ROS: negative Respiratory ROS: positive for - shortness of breath negative for - cough, hemoptysis, pleuritic pain, tachypnea or wheezing Cardiovascular ROS: no chest pain or dyspnea on exertion Genito-Urinary ROS: no dysuria, trouble voiding, or hematuria Musculoskeletal ROS: negative Neurological ROS: no TIA or stroke symptoms Dermatological ROS: negative  Physical Examination: BP 103/84 (BP Location: Left Arm)   Pulse 98   Temp 97.9 F (36.6 C) (Oral)   Resp 16   Ht 5' (1.524 m)   Wt 58 kg   LMP 02/06/2019   SpO2 97%   BMI 24.96 kg/m  Physical Exam Vitals signs reviewed.  Constitutional:      General: She is not in acute distress.    Appearance: She is ill-appearing.  HENT:     Head: Normocephalic and atraumatic.  Eyes:     Extraocular Movements: Extraocular movements intact.  Cardiovascular:     Rate and Rhythm: Normal rate.     Heart sounds: No murmur. No gallop.   Pulmonary:     Effort: Pulmonary effort is normal. No respiratory distress.     Breath sounds: No stridor. No rhonchi or rales.  Abdominal:     General: Abdomen is protuberant. Bowel sounds are increased. There is distension.     Palpations: There is shifting dullness and fluid wave.     Tenderness: There is generalized abdominal tenderness. There is no rebound.     Hernia: No hernia is present.  Skin:    Coloration: Skin is pale.     Findings: No rash.  Psychiatric:        Mood and Affect: Mood is anxious.     Data: Lab Results  Component Value Date   WBC 13.3 (H) 03/24/2019   HGB 10.3 (L) 03/24/2019   HCT 29.4 (L) 03/24/2019   MCV 113.1 (H) 03/24/2019   PLT 180 03/24/2019   Recent Labs  Lab 03/22/19 2107  03/23/19 0443 03/24/19 0624  HGB 12.3 11.1* 10.3*   Lab Results  Component Value Date   NA 134 (L) 03/24/2019   K 3.4 (L) 03/24/2019   CL 105 03/24/2019   CO2 24 03/24/2019   BUN 8 03/24/2019   CREATININE 0.68 03/24/2019   Lab Results  Component Value Date   ALT 49 (H) 03/24/2019   AST 218 (H) 03/24/2019   ALKPHOS 186 (H) 03/24/2019   BILITOT 2.6 (H) 03/24/2019   Recent Labs  Lab 03/22/19 2349 03/23/19 1908  APTT  --  36  INR 1.6*  --    CBC Latest Ref Rng & Units 03/24/2019 03/23/2019 03/22/2019  WBC 4.0 - 10.5 K/uL 13.3(H) 15.9(H) 21.0(H)  Hemoglobin 12.0 - 15.0 g/dL 10.3(L) 11.1(L) 12.3  Hematocrit 36.0 - 46.0 % 29.4(L) 30.9(L) 34.1(L)  Platelets 150 - 400 K/uL 180 209  249    STUDIES: Dg Chest Port 1 View  Result Date: 03/23/2019 CLINICAL DATA:  Pain EXAM: PORTABLE CHEST 1 VIEW COMPARISON:  10/28/2018 FINDINGS: The heart size and mediastinal contours are within normal limits. Both lungs are clear. The visualized skeletal structures are unremarkable. Postsurgical changes are noted in the left axilla. IMPRESSION: No active disease. Electronically Signed   By: Inez Catalina M.D.   On: 03/23/2019 00:01   US Abdomen Limited Ruq  Result Date: 03/23/2019 CLINICAL DATA:  Cirrhosis EXAM: ULTRASOUND ABDOMEN LIMITED RIGHT UPPER QUADRANT COMPARISON:  CT abdomen and pelvis 10/29/2018 FINDINGS: Gallbladder: Thickened gallbladder wall nonspecific in the setting of ascites. Sludge within gallbladder. Additional probable tiny shadowing calculi. No sonographic Murphy sign. Common bile duct: Diameter: 7 mm, minimally prominent for age Liver: Echogenic hepatic parenchyma with minimally nodular hepatic margins consistent with cirrhosis. No focal hepatic mass lesion or intrahepatic biliary dilatation. Portal vein demonstrates internal echogenicity and absent spontaneous flow consistent with portal vein thrombosis. Portal vein was opacified and patent on the prior CT exam. Scattered ascites.  IMPRESSION: Sludge and probable tiny calculi within gallbladder with diffuse thickening of the gallbladder wall, a nonspecific finding in the setting of ascites. Slight biliary dilatation, CBD 7 mm diameter, recommend correlation with LFTs. Cirrhotic appearing liver with internal echogenicity and absent spontaneous venous flow within portal vein consistent with portal vein thrombosis. Critical Value/emergent results were called by telephone at the time of interpretation on 03/23/2019 at 6:33 pm to Dr. Margaretmary Eddy, who verbally acknowledged these results. Electronically Signed   By: Lavonia Dana M.D.   On: 03/23/2019 18:33   @IMAGES @  Assessment: 1. Alcoholic hepatitis with maddrey score of 4. Not a candidate for IV steroids.   2. Abdominal pain - I favor the pain appears more likely secondary to tense ascites than from Portal vein thrombosis. Hepatocellular carcinoma needs ruled out.  3. Portal vein thrombosis - Appears present on U/S. May be acute but I think is possibly chronic. Noncontrasted CT, which can often pick these up, may miss them at times as the clot can blend in with vascular structures. A contrasted study may have diagnosed it earlier. NO obvious computer documentaton of a contrasted CT of the abdomen or pelvis on Care Everywhere since 2007.  4. Alcoholic cirrhosis with MELD score of 17. Ascites diffuse and massive.   5. Polypsubstance abuse.  6. Alcoholism.  COVID-19 status: Negative   Recommendations:  1. I called Dr. Lorenso Courier for her opinion.  We discussed the various issues and risks of PVT in general along with TIPS therapy for PVT. Certainly it is reasonable to consider TIPS to help PVT as it helps re-establish portal blood flow and can "wash out" the clot. This patient, in my experience, is not at high risk from serious mesenteric or other vascular clots in the setting of cirrhosis. TIPS may be considered in another setting, particularly if the patient continues to drink or has GI  bleeding from gastric varices secondary to possible splenic vein thrombosis in the future. We discussed there is some anecdotal evidence of benefit of anticoagulation for PVT in cirrhosis, but both of [place] determined together that clinical benefit is questionable from these small studies.   So based on our discussion, we have determined that close monitoring will be continued, CT with contrast will be ordered to rule out hepatic malignancy or other tumours that may cause a hypercoagulable state. We will stop the heparin gtt. A large volume paracentesis will be ordered. I agree  with IV rocephin.  Will need low sodium diet and diuretic therapy ultimately. I will begin a potassium sparing diuretic given recent hypokalemia.   We will hold on transfer to tertiary care center and reorder if needed depending on patient response to therapy.   I will follow along with you.   Thank you for the consult. Please call with questions or concerns.  Olean Ree, "Lanny Hurst MD [facility] [place], [place] [address] [phone]  03/24/2019 11:52 AM

## 2019-03-24 NOTE — Consult Note (Addendum)
ANTICOAGULATION CONSULT NOTE - Follow Up Consult  Pharmacy Consult for Heparin dosing  Indication: acute portal vein thrombosis  Allergies  Allergen Reactions  . Sulfa Antibiotics     Patient Measurements: Height: 5' (152.4 cm) Weight: 127 lb 12.8 oz (58 kg) IBW/kg (Calculated) : 45.5 Heparin Dosing Weight: 57.2 kg   Vital Signs: Temp: 97.9 F (36.6 C) (07/26 0414) Temp Source: Oral (07/26 0414) BP: 103/84 (07/26 0414) Pulse Rate: 98 (07/26 0414)  Labs: Recent Labs    03/22/19 2107 03/22/19 2349 03/23/19 0443 03/23/19 1908 03/24/19 0025 03/24/19 0624  HGB 12.3  --  11.1*  --   --  10.3*  HCT 34.1*  --  30.9*  --   --  29.4*  PLT 249  --  209  --   --  180  APTT  --   --   --  36  --   --   LABPROT  --  18.6*  --   --   --   --   INR  --  1.6*  --   --   --   --   HEPARINUNFRC  --   --   --   --  0.32 0.31  CREATININE 0.78  --  0.61  --   --  0.68    Estimated Creatinine Clearance: 72.3 mL/min (by C-G formula based on SCr of 0.68 mg/dL).   Medications:  No PTA anticoagulants Enoxaparin was ordered yet not given   Assessment: Patient has a PMH significant for alcoholic hepatitis without ascites and pharmacy was consulted for acute portal vein thrombosis. Attending discussed with patient about transferring to an outside hospital for a higher level of care - possibly [facility] pending the patient's status.   7/25 Heparin infusion started @ 900 units/hr.  7/26 @ 0025 HL: 0.32.  Level is therapeutic x1.  Goal of Therapy:  Heparin level 0.3-0.7 units/ml Monitor platelets by anticoagulation protocol: Yes   Plan:  7/26 @ 0624 HL: 0.31. Level is therapeutic x2. Since level is just barely therapeutic will increase heparin infusion to 950 units/hr.  Will recheck confirmatory level in 6 hours.  Check anti-Xa and CBC daily while on heparin, per protocol. Continue to monitor H&H and platelets  Pernell Dupre, PharmD, BCPS Clinical Pharmacist 03/24/2019 6:51 AM

## 2019-03-25 ENCOUNTER — Inpatient Hospital Stay: Payer: Self-pay

## 2019-03-25 LAB — HEPATITIS PANEL, ACUTE
HCV Ab: >11 s/co ratio — ABNORMAL HIGH (ref 0.0–0.9)
Hep A IgM: NEGATIVE
Hep B C IgM: NEGATIVE
Hepatitis B Surface Ag: NEGATIVE

## 2019-03-25 LAB — URINE CULTURE: Culture: >=100000 — AB

## 2019-03-25 LAB — CBC
HCT: 30.4 % — ABNORMAL LOW (ref 36.0–46.0)
Hemoglobin: 10.4 g/dL — ABNORMAL LOW (ref 12.0–15.0)
MCH: 39.1 pg — ABNORMAL HIGH (ref 26.0–34.0)
MCHC: 34.2 g/dL (ref 30.0–36.0)
MCV: 114.3 fL — ABNORMAL HIGH (ref 80.0–100.0)
Platelets: 180 10*3/uL (ref 150–400)
RBC: 2.66 MIL/uL — ABNORMAL LOW (ref 3.87–5.11)
RDW: 17.1 % — ABNORMAL HIGH (ref 11.5–15.5)
WBC: 10.9 10*3/uL — ABNORMAL HIGH (ref 4.0–10.5)
nRBC: 0 % (ref 0.0–0.2)

## 2019-03-25 LAB — BODY FLUID CELL COUNT WITH DIFFERENTIAL
Eos, Fluid: 0 %
Lymphs, Fluid: 42 %
Monocyte-Macrophage-Serous Fluid: 52 %
Neutrophil Count, Fluid: 6 %
Total Nucleated Cell Count, Fluid: 70 cu mm

## 2019-03-25 LAB — COMPREHENSIVE METABOLIC PANEL
ALT: 46 U/L — ABNORMAL HIGH (ref 0–44)
AST: 204 U/L — ABNORMAL HIGH (ref 15–41)
Albumin: 2 g/dL — ABNORMAL LOW (ref 3.5–5.0)
Alkaline Phosphatase: 184 U/L — ABNORMAL HIGH (ref 38–126)
Anion gap: 5 (ref 5–15)
BUN: 7 mg/dL (ref 6–20)
CO2: 22 mmol/L (ref 22–32)
Calcium: 7.5 mg/dL — ABNORMAL LOW (ref 8.9–10.3)
Chloride: 107 mmol/L (ref 98–111)
Creatinine, Ser: 0.5 mg/dL (ref 0.44–1.00)
GFR calc Af Amer: >60 mL/min (ref >60)
GFR calc non Af Amer: >60 mL/min (ref >60)
Glucose, Bld: 111 mg/dL — ABNORMAL HIGH (ref 70–99)
Potassium: 4.1 mmol/L (ref 3.5–5.1)
Sodium: 134 mmol/L — ABNORMAL LOW (ref 135–145)
Total Bilirubin: 2.3 mg/dL — ABNORMAL HIGH (ref 0.3–1.2)
Total Protein: 5 g/dL — ABNORMAL LOW (ref 6.5–8.1)

## 2019-03-25 LAB — PROTEIN, PLEURAL OR PERITONEAL FLUID: Total protein, fluid: <3 g/dL

## 2019-03-25 LAB — HIV ANTIBODY (ROUTINE TESTING W REFLEX): HIV Screen 4th Generation wRfx: NONREACTIVE

## 2019-03-25 LAB — ALBUMIN, PLEURAL OR PERITONEAL FLUID: Albumin, Fluid: <1 g/dL

## 2019-03-25 LAB — PATHOLOGIST SMEAR REVIEW

## 2019-03-25 LAB — GLUCOSE, PLEURAL OR PERITONEAL FLUID: Glucose, Fluid: 116 mg/dL

## 2019-03-25 MED ORDER — LACTULOSE 10 GM/15ML PO SOLN
30.0000 g | Freq: Every day | ORAL | Status: DC
  Administered 2019-03-25 – 2019-03-26 (×2): 30 g via ORAL
  Filled 2019-03-25 (×2): qty 60

## 2019-03-25 MED ORDER — ENOXAPARIN SODIUM 40 MG/0.4ML ~~LOC~~ SOLN
40.0000 mg | SUBCUTANEOUS | Status: DC
  Administered 2019-03-25: 40 mg via SUBCUTANEOUS
  Filled 2019-03-25: qty 0.4

## 2019-03-25 MED ORDER — SPIRONOLACTONE 25 MG PO TABS
50.0000 mg | ORAL_TABLET | Freq: Every day | ORAL | Status: DC
  Administered 2019-03-25 – 2019-03-26 (×2): 50 mg via ORAL
  Filled 2019-03-25 (×2): qty 2

## 2019-03-25 MED ORDER — FUROSEMIDE 20 MG PO TABS
20.0000 mg | ORAL_TABLET | Freq: Every day | ORAL | Status: DC
  Administered 2019-03-25 – 2019-03-26 (×2): 20 mg via ORAL
  Filled 2019-03-25 (×2): qty 1

## 2019-03-25 MED ORDER — SERTRALINE HCL 50 MG PO TABS
50.0000 mg | ORAL_TABLET | Freq: Two times a day (BID) | ORAL | Status: DC
  Administered 2019-03-25 – 2019-03-26 (×3): 50 mg via ORAL
  Filled 2019-03-25 (×3): qty 1

## 2019-03-25 MED ORDER — BUPROPION HCL ER (XL) 150 MG PO TB24
300.0000 mg | ORAL_TABLET | Freq: Every day | ORAL | Status: DC
  Administered 2019-03-25 – 2019-03-26 (×2): 300 mg via ORAL
  Filled 2019-03-25 (×2): qty 2

## 2019-03-25 MED ORDER — GABAPENTIN 300 MG PO CAPS
900.0000 mg | ORAL_CAPSULE | Freq: Three times a day (TID) | ORAL | Status: DC
  Administered 2019-03-25 – 2019-03-26 (×3): 900 mg via ORAL
  Filled 2019-03-25 (×3): qty 3

## 2019-03-25 NOTE — Progress Notes (Signed)
[facility] Gastroenterology Inpatient Progress Note  Subjective: Patient seen for follow-up ascites, cirrhosis, portal vein thrombosis.  CT scan obtained and appears to show no evidence of mesenteric or portal vein thrombosis.  Patient is also status post large-volume paracentesis and remarks that she feels "much better".  There is little to no abdominal pain today.  Diuretics have been initiated.  Objective: Vital signs in last 24 hours: Temp:  [97.9 F (36.6 C)-98.6 F (37 C)] 98.6 F (37 C) (07/27 1509) Pulse Rate:  [98-113] 98 (07/27 1509) Resp:  [16] 16 (07/27 0438) BP: (84-119)/(56-87) 84/56 (07/27 1509) SpO2:  [95 %-100 %] 99 % (07/27 1509) Blood pressure (!) 84/56, pulse 98, temperature 98.6 F (37 C), temperature source Oral, resp. rate 16, height 5' (1.524 m), weight 58 kg, last menstrual period 02/06/2019, SpO2 99 %.    Intake/Output from previous day: 07/26 0701 - 07/27 0700 In: 2670.6 [P.O.:886; I.V.:1698.4; IV Piggyback:86.3] Out: -   Intake/Output this shift: Total I/O In: 690.5 [P.O.:240; I.V.:436.7; IV Piggyback:13.8] Out: -    General appearance: Alert, oriented. Resp: Clear to auscultation Cardio: Regular rate no gallop GI: Distended with fluid wave, improved from previous exam.  Bowel sounds positive.  Nontender. Extremities: Trace edema.   Lab Results: Results for orders placed or performed during the hospital encounter of 03/22/19 (from the past 24 hour(s))  CBC     Status: Abnormal   Collection Time: 03/25/19  4:32 AM  Result Value Ref Range   WBC 10.9 (H) 4.0 - 10.5 K/uL   RBC 2.66 (L) 3.87 - 5.11 MIL/uL   Hemoglobin 10.4 (L) 12.0 - 15.0 g/dL   HCT 30.4 (L) 36.0 - 46.0 %   MCV 114.3 (H) 80.0 - 100.0 fL   MCH 39.1 (H) 26.0 - 34.0 pg   MCHC 34.2 30.0 - 36.0 g/dL   RDW 17.1 (H) 11.5 - 15.5 %   Platelets 180 150 - 400 K/uL   nRBC 0.0 0.0 - 0.2 %  Comprehensive metabolic panel     Status: Abnormal   Collection Time: 03/25/19  4:32 AM   Result Value Ref Range   Sodium 134 (L) 135 - 145 mmol/L   Potassium 4.1 3.5 - 5.1 mmol/L   Chloride 107 98 - 111 mmol/L   CO2 22 22 - 32 mmol/L   Glucose, Bld 111 (H) 70 - 99 mg/dL   BUN 7 6 - 20 mg/dL   Creatinine, Ser 0.50 0.44 - 1.00 mg/dL   Calcium 7.5 (L) 8.9 - 10.3 mg/dL   Total Protein 5.0 (L) 6.5 - 8.1 g/dL   Albumin 2.0 (L) 3.5 - 5.0 g/dL   AST 204 (H) 15 - 41 U/L   ALT 46 (H) 0 - 44 U/L   Alkaline Phosphatase 184 (H) 38 - 126 U/L   Total Bilirubin 2.3 (H) 0.3 - 1.2 mg/dL   GFR calc non Af Amer >60 >60 mL/min   GFR calc Af Amer >60 >60 mL/min   Anion gap 5 5 - 15  Body fluid cell count with differential     Status: Abnormal   Collection Time: 03/25/19 10:45 AM  Result Value Ref Range   Fluid Type-FCT CYTOPERI    Color, Fluid STRAW (A) YELLOW   Appearance, Fluid HAZY (A) CLEAR   WBC, Fluid 70 cu mm   Neutrophil Count, Fluid 6 %   Lymphs, Fluid 42 %   Monocyte-Macrophage-Serous Fluid 52 %   Eos, Fluid 0 %  Albumin, pleural or peritoneal  fluid     Status: None   Collection Time: 03/25/19 10:45 AM  Result Value Ref Range   Albumin, Fluid <1.0 g/dL   Fluid Type-FALB CYTOPERI   Protein, pleural or peritoneal fluid     Status: None   Collection Time: 03/25/19 10:45 AM  Result Value Ref Range   Total protein, fluid <3.0 g/dL   Fluid Type-FTP CYTOPERI   Glucose, pleural or peritoneal fluid     Status: None   Collection Time: 03/25/19 10:45 AM  Result Value Ref Range   Glucose, Fluid 116 mg/dL   Fluid Type-FGLU CYTOPERI   Pathologist smear review     Status: None   Collection Time: 03/25/19 10:45 AM  Result Value Ref Range   Path Review Cytospin slide of peritoneal fluid reviewed.      Recent Labs    03/23/19 0443 03/24/19 0624 03/25/19 0432  WBC 15.9* 13.3* 10.9*  HGB 11.1* 10.3* 10.4*  HCT 30.9* 29.4* 30.4*  PLT 209 180 180   BMET Recent Labs    03/23/19 0443 03/23/19 2018 03/24/19 0624 03/25/19 0432  NA 129*  --  134* 134*  K 2.3* 2.9* 3.4*  4.1  CL 96*  --  105 107  CO2 22  --  24 22  GLUCOSE 113*  --  101* 111*  BUN 6  --  8 7  CREATININE 0.61  --  0.68 0.50  CALCIUM 9.4  --  8.2* 7.5*   LFT Recent Labs    03/25/19 0432  PROT 5.0*  ALBUMIN 2.0*  AST 204*  ALT 46*  ALKPHOS 184*  BILITOT 2.3*   PT/INR Recent Labs    03/22/19 2349  LABPROT 18.6*  INR 1.6*   Hepatitis Panel Recent Labs    03/23/19 0443  HEPBSAG Negative  HCVAB >11.0*  HEPAIGM Negative  HEPBIGM Negative   C-Diff No results for input(s): CDIFFTOX in the last 72 hours. No results for input(s): CDIFFPCR in the last 72 hours.   Studies/Results: Ct Abdomen Pelvis W Wo Contrast  Result Date: 03/24/2019 CLINICAL DATA:  Abdominal pain, nausea and vomiting, abdominal distention. Alcoholic cirrhosis. Possible portal vein thrombosis on recent ultrasound. EXAM: CT ABDOMEN AND PELVIS WITHOUT AND WITH CONTRAST TECHNIQUE: Multidetector CT imaging of the abdomen and pelvis was performed following the standard protocol before and following the bolus administration of intravenous contrast. CONTRAST:  131mL OMNIPAQUE IOHEXOL 300 MG/ML  SOLN COMPARISON:  Right quadrant ultrasound on 03/23/2019, and noncontrast CT on 10/29/2018 FINDINGS: Lower Chest: Small bilateral pleural effusions and mild bibasilar atelectasis are new since prior CT. Hepatobiliary: Hepatic cirrhosis is is demonstrated. Two hypovascular lesions are seen in the right lobe adjacent gallbladder fossa which measure 2.5 cm on image 51/4, 1.9 cm on image 45/4. These do not show arterial phase hyperenhancement, which favors regenerative nodules or focal fatty infiltration, over hepatocellular carcinoma. There is no evidence of portal or hepatic vein thrombosis. Large amount ascites is new since exam. Recanalization paraumbilical veins is consistent portal venous hypertension. Small calcified gallstones are seen. No evidence of gallbladder wall thickening or pericholecystic inflammatory changes. No  evidence of biliary ductal dilatation. Pancreas:  No mass or inflammatory changes. Spleen: Within normal limits in size and appearance. Adrenals/Urinary Tract: No masses identified. Mild right renal scarring again noted. No evidence of hydronephrosis. Unremarkable unopacified urinary bladder. Stomach/Bowel: Moderate size hiatal hernia has increased in size. No evidence bowel obstruction. Diffuse wall thickening of small bowel and ascending colon is consistent with hypoalbuminemia. Vascular/Lymphatic: No  pathologically enlarged lymph nodes. No abdominal aortic aneurysm. Reproductive: Normal appearance uterus. A tubular cystic lesion in the right adnexa shows no significant change, consistent a moderate hydrosalpinx. An ovoid cystic lesion with a few thin septations in the left adnexa shows increase in size, currently measuring 7.3 x 5.3 cm, compared to 6.5 by 3.6 cm prior CT. This could represent a hydrosalpinx or cystic ovarian lesion. Other:  None. Musculoskeletal:  No suspicious bone lesions identified. IMPRESSION: 1. Hepatic cirrhosis, and findings of portal venous hypertension. No evidence of portal vein thrombosis. 2. Two hypovascular lesions in right hepatic lobe, which favors regenerative nodules or focal fatty infiltration over hepatocellular carcinoma. Recommend further characterization abdomen MRI without and with contrast. 3. Large amount of ascites, and diffuse bowel wall thickening consistent with hypoalbuminemia. 4. Cholelithiasis. No radiographic evidence of cholecystitis. 5. Increased size of 7.3 cm cystic lesion in left adnexa, which could represent a hydrosalpinx or cystic ovarian lesion. Consider pelvic without and with contrast for further evaluation. 6. Stable right hydrosalpinx. 7. Increased size of moderate hiatal hernia. 8. New small bilateral pleural effusions and bibasilar atelectasis. Electronically Signed   By: Marlaine Hind M.D.   On: 03/24/2019 20:11   US Paracentesis  Result Date:  03/25/2019 INDICATION: Alcoholic cirrhosis, abdominal distension, ascites EXAM: ULTRASOUND GUIDED DIAGNOSTIC AND THERAPEUTIC PARACENTESIS MEDICATIONS: 1% LIDOCAINE LOCAL COMPLICATIONS: None immediate. PROCEDURE: Informed written consent was obtained from the patient after a discussion of the risks, benefits and alternatives to treatment. A timeout was performed prior to the initiation of the procedure. Initial ultrasound scanning demonstrates a large amount of ascites within the right lower abdominal quadrant. The right lower abdomen was prepped and draped in the usual sterile fashion. 1% lidocaine with epinephrine was used for local anesthesia. Following this, a 6 Fr Safe-T-Centesis catheter was introduced. An ultrasound image was saved for documentation purposes. The paracentesis was performed. The catheter was removed and a dressing was applied. The patient tolerated the procedure well without immediate post procedural complication. FINDINGS: A total of approximately 3.3 L of CLEAR PERITONEAL fluid was removed. Samples were sent to the laboratory as requested by the clinical team. IMPRESSION: Successful ultrasound-guided paracentesis yielding 3.3 liters of peritoneal fluid. Electronically Signed   By: Jerilynn Mages.  Shick M.D.   On: 03/25/2019 11:24    Scheduled Inpatient Medications:   . buPROPion  300 mg Oral Daily  . enoxaparin (LOVENOX) injection  40 mg Subcutaneous Q24H  . folic acid  1 mg Oral Daily  . furosemide  20 mg Oral Daily  . gabapentin  900 mg Oral TID  . lactulose  30 g Oral Daily  . multivitamin with minerals  1 tablet Oral Daily  . nicotine  21 mg Transdermal Daily  . sertraline  50 mg Oral BID  . spironolactone  50 mg Oral Daily  . thiamine  100 mg Intravenous Daily    Continuous Inpatient Infusions:   . cefTRIAXone (ROCEPHIN)  IV Stopped (03/25/19 0304)    PRN Inpatient Medications:  alum & mag hydroxide-simeth, LORazepam **OR** LORazepam, morphine injection, ondansetron **OR**  ondansetron (ZOFRAN) IV    Assessment:  1. 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  2. Abdominal ascites - High SAAG, c/w portal hypertension. On diuretic therapy with aldactone and Lasix.  3. Hepatitis C - Not a candidate for therapy given persistent alcoholism. With complications also present from cirrhosis, patient will require therapy, when feasible,  at a [facility] system like [facility] or [facility].  Plan:  1. Continue diuretics. 2.  Alcohol cessation. 3. Low salt (2g) diet. 4. Alcohol rehab counseling. Can consider referral for Hep C therapy if remains abstinent for at least 6 months. 5. EGD can be done as outpatient for variceal survieillance if patient is compliant with all of the above. 6. GI sign off, call back if I can help.   Teodoro K. Alice Reichert, M.D. 03/25/2019, 6:00 PM

## 2019-03-25 NOTE — Progress Notes (Signed)
[place] [facility] [place] NAME: Abigail Burton    MR#:  [id number]  DATE OF BIRTH:  1976-01-28  SUBJECTIVE:   Patient CT abdomen showing evidence of cirrhosis but no mass.  Patient underwent ultrasound-guided paracentesis with 3.3 L of fluid removed.  Patient is complaining of some dyspepsia today.  REVIEW OF SYSTEMS:    Review of Systems  Constitutional: Negative for chills and fever.  HENT: Negative for congestion and tinnitus.   Eyes: Negative for blurred vision and double vision.  Respiratory: Negative for cough, shortness of breath and wheezing.   Cardiovascular: Negative for chest pain, orthopnea and PND.  Gastrointestinal: Positive for abdominal pain and heartburn. Negative for diarrhea, nausea and vomiting.  Genitourinary: Negative for dysuria and hematuria.  Neurological: Negative for dizziness, sensory change and focal weakness.  All other systems reviewed and are negative.   Nutrition: Heart Healthy Tolerating Diet: Yes Tolerating PT: Await Eval.   DRUG ALLERGIES:   Allergies  Allergen Reactions   Sulfa Antibiotics     VITALS:  Blood pressure 97/71, pulse (!) 102, temperature 98.3 F (36.8 C), temperature source Oral, resp. rate 16, height 5' (1.524 m), weight 58 kg, last menstrual period 02/06/2019, SpO2 96 %.  PHYSICAL EXAMINATION:   Physical Exam  GENERAL:  43 y.o.-year-old patient lying in bed in no acute distress.  EYES: Pupils equal, round, reactive to light and accommodation. No scleral icterus. Extraocular muscles intact.  HEENT: Head atraumatic, normocephalic. Oropharynx and nasopharynx clear.  NECK:  Supple, no jugular venous distention. No thyroid enlargement, no tenderness.  LUNGS: Normal breath sounds bilaterally, no wheezing, rales, rhonchi. No use of accessory muscles of respiration.  CARDIOVASCULAR: S1, S2 normal. No murmurs, rubs, or gallops.  ABDOMEN: Soft, Tender in lower abdomen, No rebound,  rigidity, Distended, + fluid wave. Bowel sounds present. No organomegaly or mass.  EXTREMITIES: No cyanosis, clubbing or edema b/l.    NEUROLOGIC: Cranial nerves II through XII are intact. No focal Motor or sensory deficits b/l.   PSYCHIATRIC: The patient is alert and oriented x 3.  SKIN: No obvious rash, lesion, or ulcer.    LABORATORY PANEL:   CBC Recent Labs  Lab 03/25/19 0432  WBC 10.9*  HGB 10.4*  HCT 30.4*  PLT 180   ------------------------------------------------------------------------------------------------------------------  Chemistries  Recent Labs  Lab 03/23/19 2018  03/25/19 0432  NA  --    < > 134*  K 2.9*   < > 4.1  CL  --    < > 107  CO2  --    < > 22  GLUCOSE  --    < > 111*  BUN  --    < > 7  CREATININE  --    < > 0.50  CALCIUM  --    < > 7.5*  MG 2.5*  --   --   AST  --    < > 204*  ALT  --    < > 46*  ALKPHOS  --    < > 184*  BILITOT  --    < > 2.3*   < > = values in this interval not displayed.   ------------------------------------------------------------------------------------------------------------------  Cardiac Enzymes No results for input(s): TROPONINI in the last 168 hours. ------------------------------------------------------------------------------------------------------------------  RADIOLOGY:  Ct Abdomen Pelvis W Wo Contrast  Result Date: 03/24/2019 CLINICAL DATA:  Abdominal pain, nausea and vomiting, abdominal distention. Alcoholic cirrhosis. Possible portal vein thrombosis on recent ultrasound. EXAM: CT ABDOMEN  AND PELVIS WITHOUT AND WITH CONTRAST TECHNIQUE: Multidetector CT imaging of the abdomen and pelvis was performed following the standard protocol before and following the bolus administration of intravenous contrast. CONTRAST:  170mL OMNIPAQUE IOHEXOL 300 MG/ML  SOLN COMPARISON:  Right quadrant ultrasound on 03/23/2019, and noncontrast CT on 10/29/2018 FINDINGS: Lower Chest: Small bilateral pleural effusions and mild  bibasilar atelectasis are new since prior CT. Hepatobiliary: Hepatic cirrhosis is is demonstrated. Two hypovascular lesions are seen in the right lobe adjacent gallbladder fossa which measure 2.5 cm on image 51/4, 1.9 cm on image 45/4. These do not show arterial phase hyperenhancement, which favors regenerative nodules or focal fatty infiltration, over hepatocellular carcinoma. There is no evidence of portal or hepatic vein thrombosis. Large amount ascites is new since exam. Recanalization paraumbilical veins is consistent portal venous hypertension. Small calcified gallstones are seen. No evidence of gallbladder wall thickening or pericholecystic inflammatory changes. No evidence of biliary ductal dilatation. Pancreas:  No mass or inflammatory changes. Spleen: Within normal limits in size and appearance. Adrenals/Urinary Tract: No masses identified. Mild right renal scarring again noted. No evidence of hydronephrosis. Unremarkable unopacified urinary bladder. Stomach/Bowel: Moderate size hiatal hernia has increased in size. No evidence bowel obstruction. Diffuse wall thickening of small bowel and ascending colon is consistent with hypoalbuminemia. Vascular/Lymphatic: No pathologically enlarged lymph nodes. No abdominal aortic aneurysm. Reproductive: Normal appearance uterus. A tubular cystic lesion in the right adnexa shows no significant change, consistent a moderate hydrosalpinx. An ovoid cystic lesion with a few thin septations in the left adnexa shows increase in size, currently measuring 7.3 x 5.3 cm, compared to 6.5 by 3.6 cm prior CT. This could represent a hydrosalpinx or cystic ovarian lesion. Other:  None. Musculoskeletal:  No suspicious bone lesions identified. IMPRESSION: 1. Hepatic cirrhosis, and findings of portal venous hypertension. No evidence of portal vein thrombosis. 2. Two hypovascular lesions in right hepatic lobe, which favors regenerative nodules or focal fatty infiltration over  hepatocellular carcinoma. Recommend further characterization abdomen MRI without and with contrast. 3. Large amount of ascites, and diffuse bowel wall thickening consistent with hypoalbuminemia. 4. Cholelithiasis. No radiographic evidence of cholecystitis. 5. Increased size of 7.3 cm cystic lesion in left adnexa, which could represent a hydrosalpinx or cystic ovarian lesion. Consider pelvic without and with contrast for further evaluation. 6. Stable right hydrosalpinx. 7. Increased size of moderate hiatal hernia. 8. New small bilateral pleural effusions and bibasilar atelectasis. Electronically Signed   By: Marlaine Hind M.D.   On: 03/24/2019 20:11   US Paracentesis  Result Date: 03/25/2019 INDICATION: Alcoholic cirrhosis, abdominal distension, ascites EXAM: ULTRASOUND GUIDED DIAGNOSTIC AND THERAPEUTIC PARACENTESIS MEDICATIONS: 1% LIDOCAINE LOCAL COMPLICATIONS: None immediate. PROCEDURE: Informed written consent was obtained from the patient after a discussion of the risks, benefits and alternatives to treatment. A timeout was performed prior to the initiation of the procedure. Initial ultrasound scanning demonstrates a large amount of ascites within the right lower abdominal quadrant. The right lower abdomen was prepped and draped in the usual sterile fashion. 1% lidocaine with epinephrine was used for local anesthesia. Following this, a 6 Fr Safe-T-Centesis catheter was introduced. An ultrasound image was saved for documentation purposes. The paracentesis was performed. The catheter was removed and a dressing was applied. The patient tolerated the procedure well without immediate post procedural complication. FINDINGS: A total of approximately 3.3 L of CLEAR PERITONEAL fluid was removed. Samples were sent to the laboratory as requested by the clinical team. IMPRESSION: Successful ultrasound-guided paracentesis yielding 3.3 liters of  peritoneal fluid. Electronically Signed   By: Jerilynn Mages.  Shick M.D.   On: 03/25/2019  11:24   US Abdomen Limited Ruq  Result Date: 03/23/2019 CLINICAL DATA:  Cirrhosis EXAM: ULTRASOUND ABDOMEN LIMITED RIGHT UPPER QUADRANT COMPARISON:  CT abdomen and pelvis 10/29/2018 FINDINGS: Gallbladder: Thickened gallbladder wall nonspecific in the setting of ascites. Sludge within gallbladder. Additional probable tiny shadowing calculi. No sonographic Murphy sign. Common bile duct: Diameter: 7 mm, minimally prominent for age Liver: Echogenic hepatic parenchyma with minimally nodular hepatic margins consistent with cirrhosis. No focal hepatic mass lesion or intrahepatic biliary dilatation. Portal vein demonstrates internal echogenicity and absent spontaneous flow consistent with portal vein thrombosis. Portal vein was opacified and patent on the prior CT exam. Scattered ascites. IMPRESSION: Sludge and probable tiny calculi within gallbladder with diffuse thickening of the gallbladder wall, a nonspecific finding in the setting of ascites. Slight biliary dilatation, CBD 7 mm diameter, recommend correlation with LFTs. Cirrhotic appearing liver with internal echogenicity and absent spontaneous venous flow within portal vein consistent with portal vein thrombosis. Critical Value/emergent results were called by telephone at the time of interpretation on 03/23/2019 at 6:33 pm to Dr. Margaretmary Eddy, who verbally acknowledged these results. Electronically Signed   By: Lavonia Dana M.D.   On: 03/23/2019 18:33     ASSESSMENT AND PLAN:   43 year old female with past medical history of alcohol abuse, depression who presents to the hospital due to abdominal pain nausea and vomiting.  1.  Abdominal pain/nausea-secondary to alcoholic hepatitis/Liver Cirrhosis/Portal vein thrombosis/Ascites.     -Status post ultrasound-guided paracentesis today with 3.3 L of fluid removed.  Patient feels better. -We will start the patient on some oral diuretics with Lasix and Aldactone.  2. Portal Vein Thrombosis/Cirrhosis - due to alcohol  abuse.  - CT abdomen/pelvis showing no portal vein thrombosis.   - appreciate GI input and cont. Current care for now. NO evidence of hepatic encephalopathy.  - s/p US guided paracentesis and had 3 L of fluid removed.  Start on Oral diuretics today.   3.  Hypokalemia/hypomagnesemia- secondary to the nausea vomiting and alcohol abuse. -Improved and resolved with supplementation.  4.  Urinary tract infection-based off a urinalysis on admission. -Continue IV ceftriaxone, urine cultures growing Proteus and will treat for 3 days and stop abx.  5. hyponatremia- mild, asymptomatic. -Secondary to alcohol abuse.  Improved and resolved with fluids.   6.  Leukocytosis-suspected to be reactive/secondary to underlying UTI.  - improving with IV abx.   7.  Alcohol abuse- no evidence of ETOH withdrawal.  - Continue CIWA protocol, continue thiamine, folate.  8. Hx of Hep C -patient has not been treated in the past.  Continue follow-up with GI as an outpatient.  As per mother's request she wanted the patient transferred to [facility], I placed a call to the [facility] transfer center and spoke to Hepatologist and they are not accepting transfers presently due to the COVID-19 pandemic.  Patient's mother has been notified of this.  Patient can follow-up at the hepatology clinic as an outpatient after discharge.  Updated patient's mother on the plan of care.  All the records are reviewed and case discussed with Care Management/Social Worker. Management plans discussed with the patient, family and they are in agreement.  CODE STATUS: Full code  DVT Prophylaxis: Lovenox  TOTAL TIME TAKING CARE OF THIS PATIENT: 35 minutes.   POSSIBLE D/C IN 2-3 DAYS, DEPENDING ON CLINICAL CONDITION.   Henreitta Leber M.D on 03/25/2019 at 12:55 PM  Between 7am to 6pm - Pager - [phone]  After 6pm go to [email or web address] - Proofreader  [facility]  Office  [phone]  CC: Primary care  physician; Patient, No Pcp Per

## 2019-03-25 NOTE — Progress Notes (Signed)
   03/25/19 0900  Clinical Encounter Type  Visited With Patient  Visit Type Initial;Psychological support  Spiritual Encounters  Spiritual Needs Emotional  Stress Factors  Patient Stress Factors Health changes  Ch was rounding. Pt expressed discomfort from constant heartburn. Pt is bothered by the idea of having to undergo surgery and told ch that is why she had been putting off coming to the hospital. Pt has dealt with cancer and it was difficult on her so she fears a similar kind of experience repeating over her experience of hospitalization. Pt has good support and said she just talked to her fiance. Ch informed her that she could always use the chaplain service. Ch will follow up to check on her and to help her deal with fear of surgery/procedures.

## 2019-03-26 LAB — COMPREHENSIVE METABOLIC PANEL
ALT: 46 U/L — ABNORMAL HIGH (ref 0–44)
AST: 199 U/L — ABNORMAL HIGH (ref 15–41)
Albumin: 2 g/dL — ABNORMAL LOW (ref 3.5–5.0)
Alkaline Phosphatase: 179 U/L — ABNORMAL HIGH (ref 38–126)
Anion gap: 6 (ref 5–15)
BUN: 6 mg/dL (ref 6–20)
CO2: 22 mmol/L (ref 22–32)
Calcium: 7.5 mg/dL — ABNORMAL LOW (ref 8.9–10.3)
Chloride: 106 mmol/L (ref 98–111)
Creatinine, Ser: 0.51 mg/dL (ref 0.44–1.00)
GFR calc Af Amer: >60 mL/min (ref >60)
GFR calc non Af Amer: >60 mL/min (ref >60)
Glucose, Bld: 108 mg/dL — ABNORMAL HIGH (ref 70–99)
Potassium: 3.7 mmol/L (ref 3.5–5.1)
Sodium: 134 mmol/L — ABNORMAL LOW (ref 135–145)
Total Bilirubin: 2.1 mg/dL — ABNORMAL HIGH (ref 0.3–1.2)
Total Protein: 5 g/dL — ABNORMAL LOW (ref 6.5–8.1)

## 2019-03-26 LAB — PROTEIN, BODY FLUID (OTHER): Total Protein, Body Fluid Other: 0.4 g/dL

## 2019-03-26 LAB — PH, BODY FLUID: pH, Body Fluid: 7.7

## 2019-03-26 LAB — AFP TUMOR MARKER: AFP, Serum, Tumor Marker: 4.3 ng/mL (ref 0.0–8.3)

## 2019-03-26 MED ORDER — LACTULOSE 10 GM/15ML PO SOLN: 30.0000 g | Freq: Every day | ORAL | 0 refills | Status: AC

## 2019-03-26 MED ORDER — SPIRONOLACTONE 50 MG PO TABS: 50.0000 mg | ORAL_TABLET | Freq: Every day | ORAL | 1 refills | Status: DC

## 2019-03-26 MED ORDER — OXYCODONE-ACETAMINOPHEN 5-325 MG PO TABS
1.0000 | ORAL_TABLET | Freq: Four times a day (QID) | ORAL | Status: DC | PRN
  Administered 2019-03-26: 10:00:00 1 via ORAL
  Filled 2019-03-26: qty 1

## 2019-03-26 MED ORDER — FUROSEMIDE 20 MG PO TABS: 20.0000 mg | ORAL_TABLET | Freq: Every day | ORAL | 1 refills | Status: DC

## 2019-03-26 NOTE — Progress Notes (Signed)
Discharge instructions given and went over with patient at bedside. Prescriptions given and reviewed. All questions answered. Patient discharged home. Qasim Diveley S, RN  

## 2019-03-26 NOTE — TOC Initial Note (Signed)
Transition of Care [facility]) - Initial/Assessment Note    Patient Details  Name: Abigail Burton MRN: [id number] Date of Birth: Feb 22, 1976  Transition of Care [facility]) CM/SW Contact:    Shelbie Hutching, RN Phone Number: 03/26/2019, 10:15 AM  Clinical Narrative:                 Patient admitted to the hospital for alcoholic hepatitis.  Patient is from home where she lives with her fiance.  Patient does not have any insurance but gets her prescriptions from [facility]- she has financial assistance through them.  Patient's fiance drives and will pick her up at discharge today.  Patient will follow up with Dr. Alice Reichert, Gastroenterology, outpatient.  Application for medication management and open door clinic given to patient if she is interested, but if she is already established with [facility] recommended staying with them.  Resources for substance abuse treatment also given to patient.  [facility] in [place] and [facility] in [place] are great outpatient resources if patient chooses to seek treatment for alcohol abuse.  Patient is getting 3 new prescriptions and she reports she will be able to get them filled today or tomorrow at [facility].  Patient has no questions.    Expected Discharge Plan: Home/Self Care Barriers to Discharge: Barriers Resolved   Patient Goals and CMS Choice        Expected Discharge Plan and Services Expected Discharge Plan: Home/Self Care   Discharge Planning Services: CM Consult, Medication Assistance   Living arrangements for the past 2 months: Mobile Home Expected Discharge Date: 03/26/19                                    Prior Living Arrangements/Services Living arrangements for the past 2 months: Mobile Home Lives with:: Significant Other(fiance) Patient language and need for interpreter reviewed:: No Do you feel safe going back to the place where you live?: Yes      Need for Family Participation in Patient Care: Yes (Comment)(Hepatitis- will need follow  up) Care giver support system in place?: Yes (comment)(finace)   Criminal Activity/Legal Involvement Pertinent to Current Situation/Hospitalization: No - Comment as needed  Activities of Daily Living Home Assistive Devices/Equipment: None ADL Screening (condition at time of admission) Patient's cognitive ability adequate to safely complete daily activities?: Yes Is the patient deaf or have difficulty hearing?: No Does the patient have difficulty seeing, even when wearing glasses/contacts?: No Does the patient have difficulty concentrating, remembering, or making decisions?: No Patient able to express need for assistance with ADLs?: Yes Does the patient have difficulty dressing or bathing?: No Independently performs ADLs?: Yes (appropriate for developmental age) Does the patient have difficulty walking or climbing stairs?: No Weakness of Legs: None Weakness of Arms/Hands: None  Permission Sought/Granted                  Emotional Assessment Appearance:: Appears stated age Attitude/Demeanor/Rapport: Engaged Affect (typically observed): Accepting Orientation: : Oriented to Self, Oriented to Place, Oriented to  Time, Oriented to Situation Alcohol / Substance Use: Alcohol Use Psych Involvement: No (comment)  Admission diagnosis:  Hypokalemia [E87.6] Hyperammonemia (HCC) [E72.20] Pain of upper abdomen [B93.90] Alcoholic intoxication without complication (HCC) [Z00.923] Urinary tract infection without hematuria, site unspecified [N39.0] Acute liver failure without hepatic coma [K72.00] Patient Active Problem List   Diagnosis Date Noted  . Alcoholic hepatitis without ascites 03/23/2019  . UTI (urinary tract infection) 03/23/2019  .  HTN (hypertension) 03/23/2019  . Hypokalemia 03/23/2019  . Alcoholic hepatitis 70/34/0352  . Chronic hepatitis C without hepatic coma ([place]) 02/24/2019  . Hydrosalpinx 02/24/2019  . Tobacco use disorder 02/26/2018  . Oral contraceptive use  08/03/2017  . Depression 04/12/2017  . Substance induced mood disorder ([place]) 09/28/2016  . 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 History of malignant melanoma of skin 01/30/2013  . Anxiety disorder 12/27/2012  . Alcohol withdrawal ([place]) 11/21/2012  . Alcohol dependence ([place]) 09/08/2010   PCP:  Patient, No Pcp Per Pharmacy:   [facility], [place], [place] - [address] [place] [address] Phone: [phone] Fax: [facility], [place] [address] [place] [address] Phone: [phone] Fax: [phone]     Social Determinants of Health (SDOH) Interventions    Readmission Risk Interventions No flowsheet data found.

## 2019-03-26 NOTE — Discharge Summary (Signed)
[facility] [place] NAME: Abigail Burton    MR#:  [id number]  DATE OF BIRTH:  25-Mar-1976  DATE OF ADMISSION:  03/22/2019 ADMITTING PHYSICIAN: Lance Coon, MD  DATE OF DISCHARGE: 03/26/2019  PRIMARY CARE PHYSICIAN: Patient, No Pcp Per    ADMISSION DIAGNOSIS:  Hypokalemia [E87.6] Hyperammonemia ([place]) [E72.20] Pain of upper abdomen [U54.27] Alcoholic intoxication without complication ([place]) [C62.376] Urinary tract infection without hematuria, site unspecified [N39.0] Acute liver failure without hepatic coma [K72.00]  DISCHARGE DIAGNOSIS:  Principal Problem:   Alcoholic hepatitis without ascites Active Problems:   Alcohol dependence ([place])   UTI (urinary tract infection)   HTN (hypertension)   Hypokalemia   Alcoholic hepatitis   SECONDARY DIAGNOSIS:   Past Medical History:  Diagnosis Date  . Alcohol dependence ([place])   . Asthma   . Cancer ([place])    left shoulder melonoma with excision  . DDD (degenerative disc disease), lumbosacral   . Hepatitis C   . Hypertension     HOSPITAL COURSE:   43 year old female with past medical history of alcohol abuse, depression who presents to the hospital due to abdominal pain nausea and vomiting.  1.  Abdominal pain/nausea-secondary to alcoholic hepatitis/Liver Cirrhosis/Ascites.     -Patient was treated supportively with pain control and eventually underwent ultrasound-guided paracentesis and had 3.3 L of fluid removed.  Patient feels a lot better. -She will be discharged on some oral diuretics and she has no further worsening abdominal pain or nausea presently.  2.  alcoholic liver cirrhosis-patient presented to the hospital due to abdominal pain, initially underwent a right upper quadrant ultrasound which showed suspected portal vein thrombosis.  Patient underwent a CT scan of the abdomen pelvis which showed no evidence of portal vein thrombosis but underlying cirrhosis and no hepatic  mass. - Seen by gastroenterology, underwent ultrasound-guided paracentesis as mentioned above and had 3.3 L of fluid removed.  She has no evidence of hepatic encephalopathy.  She will be discharged on lactulose, oral Lasix, Aldactone and follow-up with gastroenterology. - Patient can get a baseline endoscopy done for variceal staging as an outpatient.  3.  Hypokalemia/hypomagnesemia- secondary to the nausea vomiting and alcohol abuse. -Improved and resolved with supplementation.  4.  Urinary tract infection-based off a urinalysis on admission. -Patient was treated with IV ceftriaxone for 3 days and this has been adequately treated patient's urine cultures grew Klebsiella which was sensitive to ceftriaxone.  5. hyponatremia- mild, asymptomatic. -Secondary to alcohol abuse.  Improved and resolved with fluids.   6.  Leukocytosis-suspected to be reactive/secondary to underlying UTI.  -Normalized with IV antibiotic therapy.   7.  Alcohol abuse- no evidence of ETOH withdrawal.  -Patient was placed on CIWA protocol while in the hospital and also given some thiamine folate.  She shows no evidence of alcohol withdrawal presently.  She was strongly advised to abstain from alcohol abuse.  8. Hx of Hep C -patient has not been treated in the past.   - Patient will follow-up as an outpatient with gastroenterology to initiate treatment of hep C but she first needs to be abstinent from alcohol.   DISCHARGE CONDITIONS:   Stable  CONSULTS OBTAINED:  Treatment Team:  Efrain Sella, MD  DRUG ALLERGIES:   Allergies  Allergen Reactions  . Sulfa Antibiotics     DISCHARGE MEDICATIONS:   Allergies as of 03/26/2019      Reactions   Sulfa Antibiotics       Medication List  TAKE these medications   buPROPion 300 MG 24 hr tablet Commonly known as: WELLBUTRIN XL Take 300 mg by mouth daily.   furosemide 20 MG tablet Commonly known as: LASIX Take 1 tablet (20 mg total) by mouth  daily.   gabapentin 300 MG capsule Commonly known as: NEURONTIN Take 900 mg by mouth 3 (three) times daily.   lactulose 10 GM/15ML solution Commonly known as: CHRONULAC Take 45 mLs (30 g total) by mouth daily.   sertraline 50 MG tablet Commonly known as: ZOLOFT Take 50 mg by mouth 2 (two) times a day.   spironolactone 50 MG tablet Commonly known as: ALDACTONE Take 1 tablet (50 mg total) by mouth daily.   trazodone 300 MG tablet Commonly known as: DESYREL Take 300 mg by mouth at bedtime as needed for sleep.         DISCHARGE INSTRUCTIONS:   DIET:  Regular diet  DISCHARGE CONDITION:  Stable  ACTIVITY:  Activity as tolerated  OXYGEN:  Home Oxygen: No.   Oxygen Delivery: room air  DISCHARGE LOCATION:  home   If you experience worsening of your admission symptoms, develop shortness of breath, life threatening emergency, suicidal or homicidal thoughts you must seek medical attention immediately by calling 911 or calling your MD immediately  if symptoms less severe.  You Must read complete instructions/literature along with all the possible adverse reactions/side effects for all the Medicines you take and that have been prescribed to you. Take any new Medicines after you have completely understood and accpet all the possible adverse reactions/side effects.   Please note  You were cared for by a hospitalist during your hospital stay. If you have any questions about your discharge medications or the care you received while you were in the hospital after you are discharged, you can call the unit and asked to speak with the hospitalist on call if the hospitalist that took care of you is not available. Once you are discharged, your primary care physician will handle any further medical issues. Please note that NO REFILLS for any discharge medications will be authorized once you are discharged, as it is imperative that you return to your primary care physician (or establish a  relationship with a primary care physician if you do not have one) for your aftercare needs so that they can reassess your need for medications and monitor your lab values.     Today   No acute events overnight, afebrile and hemodynamically stable.  Still complaining of some vague abdominal pain.  Status post ultrasound-guided paracentesis with 3.3 L of fluid removed yesterday.  Will discharge home today.  VITAL SIGNS:  Blood pressure 111/81, pulse 97, temperature 98 F (36.7 C), temperature source Oral, resp. rate 16, height 5' (1.524 m), weight 58 kg, last menstrual period 02/06/2019, SpO2 99 %.  I/O:    Intake/Output Summary (Last 24 hours) at 03/26/2019 1316 Last data filed at 03/26/2019 1034 Gross per 24 hour  Intake 220.79 ml  Output -  Net 220.79 ml    PHYSICAL EXAMINATION:   GENERAL:  43 y.o.-year-old patient lying in bed in no acute distress.  EYES: Pupils equal, round, reactive to light and accommodation. No scleral icterus. Extraocular muscles intact.  HEENT: Head atraumatic, normocephalic. Oropharynx and nasopharynx clear.  NECK:  Supple, no jugular venous distention. No thyroid enlargement, no tenderness.  LUNGS: Normal breath sounds bilaterally, no wheezing, rales, rhonchi. No use of accessory muscles of respiration.  CARDIOVASCULAR: S1, S2 normal. No murmurs, rubs, or  gallops.  ABDOMEN: Soft, slightly tender diffusely, No rebound, rigidity,slightly distended but soft. Bowel sounds present. No organomegaly or mass.  EXTREMITIES: No cyanosis, clubbing or edema b/l.    NEUROLOGIC: Cranial nerves II through XII are intact. No focal Motor or sensory deficits b/l.   PSYCHIATRIC: The patient is alert and oriented x 3.  SKIN: No obvious rash, lesion, or ulcer.   DATA REVIEW:   CBC Recent Labs  Lab 03/25/19 0432  WBC 10.9*  HGB 10.4*  HCT 30.4*  PLT 180    Chemistries  Recent Labs  Lab 03/23/19 2018  03/26/19 0412  NA  --    < > 134*  K 2.9*   < > 3.7  CL   --    < > 106  CO2  --    < > 22  GLUCOSE  --    < > 108*  BUN  --    < > 6  CREATININE  --    < > 0.51  CALCIUM  --    < > 7.5*  MG 2.5*  --   --   AST  --    < > 199*  ALT  --    < > 46*  ALKPHOS  --    < > 179*  BILITOT  --    < > 2.1*   < > = values in this interval not displayed.    Cardiac Enzymes No results for input(s): TROPONINI in the last 168 hours.  Microbiology Results  Results for orders placed or performed during the hospital encounter of 03/22/19  Urine Culture     Status: Abnormal   Collection Time: 03/22/19  9:07 PM   Specimen: Urine, Random  Result Value Ref Range Status   Specimen Description   Final    URINE, RANDOM Performed at [facility], [address] [place], [place] [address]    Special Requests   Final    NONE Performed at [facility], [address] [place], [place] [address]    Culture >=100,000 COLONIES/mL KLEBSIELLA PNEUMONIAE (A)  Final   Report Status 03/25/2019 FINAL  Final   Organism ID, Bacteria KLEBSIELLA PNEUMONIAE (A)  Final      Susceptibility   Klebsiella pneumoniae - MIC*    AMPICILLIN RESISTANT Resistant     CEFAZOLIN <=4 SENSITIVE Sensitive     CEFTRIAXONE <=1 SENSITIVE Sensitive     CIPROFLOXACIN <=0.25 SENSITIVE Sensitive     GENTAMICIN <=1 SENSITIVE Sensitive     IMIPENEM <=0.25 SENSITIVE Sensitive     NITROFURANTOIN 32 SENSITIVE Sensitive     TRIMETH/SULFA <=20 SENSITIVE Sensitive     AMPICILLIN/SULBACTAM <=2 SENSITIVE Sensitive     PIP/TAZO <=4 SENSITIVE Sensitive     Extended ESBL NEGATIVE Sensitive     * >=100,000 COLONIES/mL KLEBSIELLA PNEUMONIAE  Culture, blood (routine x 2)     Status: None (Preliminary result)   Collection Time: 03/22/19 10:31 PM   Specimen: BLOOD  Result Value Ref Range Status   Specimen Description BLOOD LEFT FOREARM  Final   Special Requests   Final    BOTTLES DRAWN AEROBIC AND ANAEROBIC Blood Culture results may not be optimal due to an excessive volume of blood  received in culture bottles   Culture   Final    NO GROWTH 4 DAYS Performed at [facility], [address], [place], [place] [address]    Report Status PENDING  Incomplete  Culture, blood (routine x 2)  Status: None (Preliminary result)   Collection Time: 03/22/19 10:31 PM   Specimen: BLOOD  Result Value Ref Range Status   Specimen Description BLOOD RIGHT ANTECUBITAL  Final   Special Requests   Final    BOTTLES DRAWN AEROBIC AND ANAEROBIC Blood Culture results may not be optimal due to an inadequate volume of blood received in culture bottles   Culture   Final    NO GROWTH 4 DAYS Performed at [facility], [address] [place], [place] [address]    Report Status PENDING  Incomplete  SARS Coronavirus 2 (CEPHEID - Performed in [place] hospital lab), Hosp Order     Status: None   Collection Time: 03/22/19 11:49 PM   Specimen: Nasopharyngeal Swab  Result Value Ref Range Status   SARS Coronavirus 2 NEGATIVE NEGATIVE Final    Comment: (NOTE) If result is NEGATIVE SARS-CoV-2 target nucleic acids are NOT DETECTED. The SARS-CoV-2 RNA is generally detectable in upper and lower  respiratory specimens during the acute phase of infection. The lowest  concentration of SARS-CoV-2 viral copies this assay can detect is 250  copies / mL. A negative result does not preclude SARS-CoV-2 infection  and should not be used as the sole basis for treatment or other  patient management decisions.  A negative result may occur with  improper specimen collection / handling, submission of specimen other  than nasopharyngeal swab, presence of viral mutation(s) within the  areas targeted by this assay, and inadequate number of viral copies  (<250 copies / mL). A negative result must be combined with clinical  observations, patient history, and epidemiological information. If result is POSITIVE SARS-CoV-2 target nucleic acids are DETECTED. The SARS-CoV-2 RNA is generally  detectable in upper and lower  respiratory specimens dur ing the acute phase of infection.  Positive  results are indicative of active infection with SARS-CoV-2.  Clinical  correlation with patient history and other diagnostic information is  necessary to determine patient infection status.  Positive results do  not rule out bacterial infection or co-infection with other viruses. If result is PRESUMPTIVE POSTIVE SARS-CoV-2 nucleic acids MAY BE PRESENT.   A presumptive positive result was obtained on the submitted specimen  and confirmed on repeat testing.  While 2019 novel coronavirus  (SARS-CoV-2) nucleic acids may be present in the submitted sample  additional confirmatory testing may be necessary for epidemiological  and / or clinical management purposes  to differentiate between  SARS-CoV-2 and other Sarbecovirus currently known to infect humans.  If clinically indicated additional testing with an alternate test  methodology [phone]) is advised. The SARS-CoV-2 RNA is generally  detectable in upper and lower respiratory sp ecimens during the acute  phase of infection. The expected result is Negative. Fact Sheet for Patients:  StrictlyIdeas.no Fact Sheet for Healthcare Providers: BankingDealers.co.za This test is not yet approved or cleared by the [place] FDA and has been authorized for detection and/or diagnosis of SARS-CoV-2 by FDA under an Emergency Use Authorization (EUA).  This EUA will remain in effect (meaning this test can be used) for the duration of the COVID-19 declaration under Section 564(b)(1) of the Act, 21 U.S.C. section 360bbb-3(b)(1), unless the authorization is terminated or revoked sooner. Performed at [facility], [address] [place], [place] [address]   Body fluid culture     Status: None (Preliminary result)   Collection Time: 03/25/19 10:45 AM   Specimen: [facility] Cytology Peritoneal fluid   Result Value Ref Range Status  Specimen Description   Final    PERITONEAL Performed at [facility], [address] [place], [place] [address]    Special Requests   Final    NONE Performed at [facility], [place], [place] [address]    Gram Stain NO WBC SEEN NO ORGANISMS SEEN   Final   Culture   Final    NO GROWTH < 12 HOURS Performed at [facility], [place] [address], [place], [place] [address]    Report Status PENDING  Incomplete    RADIOLOGY:  Ct Abdomen Pelvis W Wo Contrast  Result Date: 03/24/2019 CLINICAL DATA:  Abdominal pain, nausea and vomiting, abdominal distention. Alcoholic cirrhosis. Possible portal vein thrombosis on recent ultrasound. EXAM: CT ABDOMEN AND PELVIS WITHOUT AND WITH CONTRAST TECHNIQUE: Multidetector CT imaging of the abdomen and pelvis was performed following the standard protocol before and following the bolus administration of intravenous contrast. CONTRAST:  164mL OMNIPAQUE IOHEXOL 300 MG/ML  SOLN COMPARISON:  Right quadrant ultrasound on 03/23/2019, and noncontrast CT on 10/29/2018 FINDINGS: Lower Chest: Small bilateral pleural effusions and mild bibasilar atelectasis are new since prior CT. Hepatobiliary: Hepatic cirrhosis is is demonstrated. Two hypovascular lesions are seen in the right lobe adjacent gallbladder fossa which measure 2.5 cm on image 51/4, 1.9 cm on image 45/4. These do not show arterial phase hyperenhancement, which favors regenerative nodules or focal fatty infiltration, over hepatocellular carcinoma. There is no evidence of portal or hepatic vein thrombosis. Large amount ascites is new since exam. Recanalization paraumbilical veins is consistent portal venous hypertension. Small calcified gallstones are seen. No evidence of gallbladder wall thickening or pericholecystic inflammatory changes. No evidence of biliary ductal dilatation. Pancreas:  No mass or inflammatory changes. Spleen: Within normal  limits in size and appearance. Adrenals/Urinary Tract: No masses identified. Mild right renal scarring again noted. No evidence of hydronephrosis. Unremarkable unopacified urinary bladder. Stomach/Bowel: Moderate size hiatal hernia has increased in size. No evidence bowel obstruction. Diffuse wall thickening of small bowel and ascending colon is consistent with hypoalbuminemia. Vascular/Lymphatic: No pathologically enlarged lymph nodes. No abdominal aortic aneurysm. Reproductive: Normal appearance uterus. A tubular cystic lesion in the right adnexa shows no significant change, consistent a moderate hydrosalpinx. An ovoid cystic lesion with a few thin septations in the left adnexa shows increase in size, currently measuring 7.3 x 5.3 cm, compared to 6.5 by 3.6 cm prior CT. This could represent a hydrosalpinx or cystic ovarian lesion. Other:  None. Musculoskeletal:  No suspicious bone lesions identified. IMPRESSION: 1. Hepatic cirrhosis, and findings of portal venous hypertension. No evidence of portal vein thrombosis. 2. Two hypovascular lesions in right hepatic lobe, which favors regenerative nodules or focal fatty infiltration over hepatocellular carcinoma. Recommend further characterization abdomen MRI without and with contrast. 3. Large amount of ascites, and diffuse bowel wall thickening consistent with hypoalbuminemia. 4. Cholelithiasis. No radiographic evidence of cholecystitis. 5. Increased size of 7.3 cm cystic lesion in left adnexa, which could represent a hydrosalpinx or cystic ovarian lesion. Consider pelvic without and with contrast for further evaluation. 6. Stable right hydrosalpinx. 7. Increased size of moderate hiatal hernia. 8. New small bilateral pleural effusions and bibasilar atelectasis. Electronically Signed   By: Marlaine Hind M.D.   On: 03/24/2019 20:11   US Paracentesis  Result Date: 03/25/2019 INDICATION: Alcoholic cirrhosis, abdominal distension, ascites EXAM: ULTRASOUND GUIDED  DIAGNOSTIC AND THERAPEUTIC PARACENTESIS MEDICATIONS: 1% LIDOCAINE LOCAL COMPLICATIONS: None immediate. PROCEDURE: Informed written consent was obtained from the patient after a  discussion of the risks, benefits and alternatives to treatment. A timeout was performed prior to the initiation of the procedure. Initial ultrasound scanning demonstrates a large amount of ascites within the right lower abdominal quadrant. The right lower abdomen was prepped and draped in the usual sterile fashion. 1% lidocaine with epinephrine was used for local anesthesia. Following this, a 6 Fr Safe-T-Centesis catheter was introduced. An ultrasound image was saved for documentation purposes. The paracentesis was performed. The catheter was removed and a dressing was applied. The patient tolerated the procedure well without immediate post procedural complication. FINDINGS: A total of approximately 3.3 L of CLEAR PERITONEAL fluid was removed. Samples were sent to the laboratory as requested by the clinical team. IMPRESSION: Successful ultrasound-guided paracentesis yielding 3.3 liters of peritoneal fluid. Electronically Signed   By: Jerilynn Mages.  Shick M.D.   On: 03/25/2019 11:24      Management plans discussed with the patient, family and they are in agreement.  CODE STATUS:     Code Status Orders  (From admission, onward)         Start     Ordered   03/23/19 0237  Full code  Continuous     03/23/19 0236        TOTAL TIME TAKING CARE OF THIS PATIENT: 40 minutes.    Henreitta Leber M.D on 03/26/2019 at 1:16 PM  Between 7am to 6pm - Pager - [phone]  After 6pm go to [email or web address] - Proofreader  [facility]  Office  [phone]  CC: Primary care physician; Patient, No Pcp Per

## 2019-03-26 NOTE — Progress Notes (Signed)
Transportation home to be available at 1400. Madlyn Frankel, RN

## 2019-03-27 LAB — CULTURE, BLOOD (ROUTINE X 2)
Culture: NO GROWTH
Culture: NO GROWTH

## 2019-03-29 ENCOUNTER — Emergency Department: Payer: Self-pay

## 2019-03-29 ENCOUNTER — Other Ambulatory Visit: Payer: Self-pay

## 2019-03-29 ENCOUNTER — Emergency Department
Admission: EM | Admit: 2019-03-29 | Discharge: 2019-03-29 | Disposition: A | Discharge status: Home / Self Care | Payer: Self-pay | Attending: Emergency Medicine | Admitting: Emergency Medicine

## 2019-03-29 ENCOUNTER — Encounter: Payer: Self-pay | Admitting: Intensive Care

## 2019-03-29 DIAGNOSIS — Z5321 Procedure and treatment not carried out due to patient leaving prior to being seen by health care provider: Secondary | ICD-10-CM | POA: Insufficient documentation

## 2019-03-29 DIAGNOSIS — R109 Unspecified abdominal pain: Secondary | ICD-10-CM | POA: Insufficient documentation

## 2019-03-29 HISTORY — DX: Disorder of kidney and ureter, unspecified: N28.9

## 2019-03-29 LAB — CBC
HCT: 35.2 % — ABNORMAL LOW (ref 36.0–46.0)
Hemoglobin: 12 g/dL (ref 12.0–15.0)
MCH: 38.6 pg — ABNORMAL HIGH (ref 26.0–34.0)
MCHC: 34.1 g/dL (ref 30.0–36.0)
MCV: 113.2 fL — ABNORMAL HIGH (ref 80.0–100.0)
Platelets: 249 10*3/uL (ref 150–400)
RBC: 3.11 MIL/uL — ABNORMAL LOW (ref 3.87–5.11)
RDW: 15.3 % (ref 11.5–15.5)
WBC: 11.8 10*3/uL — ABNORMAL HIGH (ref 4.0–10.5)
nRBC: 0 % (ref 0.0–0.2)

## 2019-03-29 LAB — BODY FLUID CULTURE
Culture: NO GROWTH
Gram Stain: NONE SEEN

## 2019-03-29 LAB — COMPREHENSIVE METABOLIC PANEL
ALT: 47 U/L — ABNORMAL HIGH (ref 0–44)
AST: 182 U/L — ABNORMAL HIGH (ref 15–41)
Albumin: 2.4 g/dL — ABNORMAL LOW (ref 3.5–5.0)
Alkaline Phosphatase: 207 U/L — ABNORMAL HIGH (ref 38–126)
Anion gap: 9 (ref 5–15)
BUN: 7 mg/dL (ref 6–20)
CO2: 23 mmol/L (ref 22–32)
Calcium: 8.8 mg/dL — ABNORMAL LOW (ref 8.9–10.3)
Chloride: 105 mmol/L (ref 98–111)
Creatinine, Ser: 0.78 mg/dL (ref 0.44–1.00)
GFR calc Af Amer: >60 mL/min (ref >60)
GFR calc non Af Amer: >60 mL/min (ref >60)
Glucose, Bld: 109 mg/dL — ABNORMAL HIGH (ref 70–99)
Potassium: 3.3 mmol/L — ABNORMAL LOW (ref 3.5–5.1)
Sodium: 137 mmol/L (ref 135–145)
Total Bilirubin: 2.7 mg/dL — ABNORMAL HIGH (ref 0.3–1.2)
Total Protein: 6 g/dL — ABNORMAL LOW (ref 6.5–8.1)

## 2019-03-29 LAB — LIPASE, BLOOD: Lipase: 66 U/L — ABNORMAL HIGH (ref 11–51)

## 2019-03-29 NOTE — ED Triage Notes (Signed)
Patient presents with abd pain/bloating/fluid on abd. Was recently diagnosed here at [facility] with cirrhosis and had fluid drawn off abd while admitted. PAtient spoke with MD about fluid buildup in abd again and was told to come to ER.

## 2019-03-29 NOTE — ED Notes (Signed)
Patient taken to U/S at this time. 

## 2019-03-29 NOTE — ED Notes (Signed)
[place] sent to lab

## 2019-04-02 ENCOUNTER — Other Ambulatory Visit
Admission: RE | Admit: 2019-04-02 | Discharge: 2019-04-02 | Disposition: A | Discharge status: Home / Self Care | Payer: Self-pay | Source: Ambulatory Visit | Attending: Student | Admitting: Student

## 2019-04-02 ENCOUNTER — Other Ambulatory Visit: Payer: Self-pay

## 2019-04-02 ENCOUNTER — Ambulatory Visit
Admission: RE | Admit: 2019-04-02 | Discharge: 2019-04-02 | Disposition: A | Discharge status: Home / Self Care | Payer: Self-pay | Source: Ambulatory Visit | Attending: Student | Admitting: Student

## 2019-04-02 ENCOUNTER — Other Ambulatory Visit: Payer: Self-pay | Admitting: Student

## 2019-04-02 DIAGNOSIS — K7031 Alcoholic cirrhosis of liver with ascites: Secondary | ICD-10-CM

## 2019-04-02 LAB — HEPATIC FUNCTION PANEL
ALT: 45 U/L — ABNORMAL HIGH (ref 0–44)
AST: 152 U/L — ABNORMAL HIGH (ref 15–41)
Albumin: 2.4 g/dL — ABNORMAL LOW (ref 3.5–5.0)
Alkaline Phosphatase: 180 U/L — ABNORMAL HIGH (ref 38–126)
Bilirubin, Direct: 1.2 mg/dL — ABNORMAL HIGH (ref 0.0–0.2)
Indirect Bilirubin: 1.5 mg/dL — ABNORMAL HIGH (ref 0.3–0.9)
Total Bilirubin: 2.7 mg/dL — ABNORMAL HIGH (ref 0.3–1.2)
Total Protein: 6.2 g/dL — ABNORMAL LOW (ref 6.5–8.1)

## 2019-04-02 LAB — CBC WITH DIFFERENTIAL/PLATELET
Abs Immature Granulocytes: 0.06 10*3/uL (ref 0.00–0.07)
Basophils Absolute: 0 10*3/uL (ref 0.0–0.1)
Basophils Relative: 0 %
Eosinophils Absolute: 0.1 10*3/uL (ref 0.0–0.5)
Eosinophils Relative: 1 %
HCT: 36.7 % (ref 36.0–46.0)
Hemoglobin: 12.5 g/dL (ref 12.0–15.0)
Immature Granulocytes: 1 %
Lymphocytes Relative: 19 %
Lymphs Abs: 2.4 10*3/uL (ref 0.7–4.0)
MCH: 38.2 pg — ABNORMAL HIGH (ref 26.0–34.0)
MCHC: 34.1 g/dL (ref 30.0–36.0)
MCV: 112.2 fL — ABNORMAL HIGH (ref 80.0–100.0)
Monocytes Absolute: 1 10*3/uL (ref 0.1–1.0)
Monocytes Relative: 8 %
Neutro Abs: 9.1 10*3/uL — ABNORMAL HIGH (ref 1.7–7.7)
Neutrophils Relative %: 71 %
Platelets: 204 10*3/uL (ref 150–400)
RBC: 3.27 MIL/uL — ABNORMAL LOW (ref 3.87–5.11)
RDW: 14.6 % (ref 11.5–15.5)
WBC: 12.7 10*3/uL — ABNORMAL HIGH (ref 4.0–10.5)
nRBC: 0 % (ref 0.0–0.2)

## 2019-04-02 LAB — BASIC METABOLIC PANEL
Anion gap: 8 (ref 5–15)
BUN: 9 mg/dL (ref 6–20)
CO2: 25 mmol/L (ref 22–32)
Calcium: 8.8 mg/dL — ABNORMAL LOW (ref 8.9–10.3)
Chloride: 104 mmol/L (ref 98–111)
Creatinine, Ser: 0.85 mg/dL (ref 0.44–1.00)
GFR calc Af Amer: >60 mL/min (ref >60)
GFR calc non Af Amer: >60 mL/min (ref >60)
Glucose, Bld: 93 mg/dL (ref 70–99)
Potassium: 3.2 mmol/L — ABNORMAL LOW (ref 3.5–5.1)
Sodium: 137 mmol/L (ref 135–145)

## 2019-04-02 LAB — PROTIME-INR
INR: 1.5 — ABNORMAL HIGH (ref 0.8–1.2)
Prothrombin Time: 18.2 seconds — ABNORMAL HIGH (ref 11.4–15.2)

## 2019-04-02 NOTE — Procedures (Signed)
Interventional Radiology Procedure:   Indications: Ascites  Procedure: US guided paracentesis  Findings: 2.9 liters of yellow fluid removed.  Complications: None     EBL: less than 10 ml  Plan: Discharge to home.     Jerret Mcbane R. Anselm Pancoast, MD  Pager: [phone]

## 2019-04-05 LAB — HEPATITIS C GENOTYPE

## 2019-04-05 LAB — HCV RNA BY PCR, QN RFX GENO
HCV RNA Qnt(log copy/mL): 6.549 log10 IU/mL
HepC Qn: 3540000 IU/mL

## 2019-04-08 ENCOUNTER — Encounter: Payer: Self-pay | Admitting: Emergency Medicine

## 2019-04-08 ENCOUNTER — Other Ambulatory Visit: Payer: Self-pay | Admitting: Student

## 2019-04-08 ENCOUNTER — Observation Stay
Admission: EM | Admit: 2019-04-08 | Discharge: 2019-04-10 | Disposition: A | Discharge status: Home / Self Care | Payer: Self-pay | Attending: Specialist | Admitting: Specialist

## 2019-04-08 ENCOUNTER — Other Ambulatory Visit: Payer: Self-pay

## 2019-04-08 ENCOUNTER — Emergency Department: Payer: Self-pay

## 2019-04-08 DIAGNOSIS — R911 Solitary pulmonary nodule: Secondary | ICD-10-CM | POA: Diagnosis present

## 2019-04-08 DIAGNOSIS — F1020 Alcohol dependence, uncomplicated: Secondary | ICD-10-CM | POA: Insufficient documentation

## 2019-04-08 DIAGNOSIS — B182 Chronic viral hepatitis C: Secondary | ICD-10-CM | POA: Insufficient documentation

## 2019-04-08 DIAGNOSIS — G629 Polyneuropathy, unspecified: Secondary | ICD-10-CM | POA: Insufficient documentation

## 2019-04-08 DIAGNOSIS — E876 Hypokalemia: Secondary | ICD-10-CM

## 2019-04-08 DIAGNOSIS — R188 Other ascites: Secondary | ICD-10-CM

## 2019-04-08 DIAGNOSIS — S01111A Laceration without foreign body of right eyelid and periocular area, initial encounter: Principal | ICD-10-CM | POA: Insufficient documentation

## 2019-04-08 DIAGNOSIS — Z881 Allergy status to other antibiotic agents status: Secondary | ICD-10-CM | POA: Insufficient documentation

## 2019-04-08 DIAGNOSIS — K7011 Alcoholic hepatitis with ascites: Secondary | ICD-10-CM | POA: Insufficient documentation

## 2019-04-08 DIAGNOSIS — J984 Other disorders of lung: Secondary | ICD-10-CM | POA: Insufficient documentation

## 2019-04-08 DIAGNOSIS — Z20828 Contact with and (suspected) exposure to other viral communicable diseases: Secondary | ICD-10-CM | POA: Insufficient documentation

## 2019-04-08 DIAGNOSIS — F329 Major depressive disorder, single episode, unspecified: Secondary | ICD-10-CM | POA: Insufficient documentation

## 2019-04-08 DIAGNOSIS — K7469 Other cirrhosis of liver: Secondary | ICD-10-CM | POA: Insufficient documentation

## 2019-04-08 DIAGNOSIS — Z85820 Personal history of malignant melanoma of skin: Secondary | ICD-10-CM | POA: Insufficient documentation

## 2019-04-08 DIAGNOSIS — F17210 Nicotine dependence, cigarettes, uncomplicated: Secondary | ICD-10-CM | POA: Insufficient documentation

## 2019-04-08 DIAGNOSIS — Z79899 Other long term (current) drug therapy: Secondary | ICD-10-CM | POA: Insufficient documentation

## 2019-04-08 DIAGNOSIS — S0181XA Laceration without foreign body of other part of head, initial encounter: Secondary | ICD-10-CM

## 2019-04-08 DIAGNOSIS — I10 Essential (primary) hypertension: Secondary | ICD-10-CM | POA: Insufficient documentation

## 2019-04-08 DIAGNOSIS — J45909 Unspecified asthma, uncomplicated: Secondary | ICD-10-CM | POA: Insufficient documentation

## 2019-04-08 DIAGNOSIS — F419 Anxiety disorder, unspecified: Secondary | ICD-10-CM | POA: Insufficient documentation

## 2019-04-08 DIAGNOSIS — K7290 Hepatic failure, unspecified without coma: Secondary | ICD-10-CM | POA: Insufficient documentation

## 2019-04-08 DIAGNOSIS — K7031 Alcoholic cirrhosis of liver with ascites: Secondary | ICD-10-CM

## 2019-04-08 DIAGNOSIS — E871 Hypo-osmolality and hyponatremia: Secondary | ICD-10-CM | POA: Insufficient documentation

## 2019-04-08 DIAGNOSIS — S0083XA Contusion of other part of head, initial encounter: Secondary | ICD-10-CM

## 2019-04-08 DIAGNOSIS — Z882 Allergy status to sulfonamides status: Secondary | ICD-10-CM | POA: Insufficient documentation

## 2019-04-08 DIAGNOSIS — R918 Other nonspecific abnormal finding of lung field: Secondary | ICD-10-CM

## 2019-04-08 LAB — CBC WITH DIFFERENTIAL/PLATELET
Abs Immature Granulocytes: 0.09 10*3/uL — ABNORMAL HIGH (ref 0.00–0.07)
Basophils Absolute: 0.1 10*3/uL (ref 0.0–0.1)
Basophils Relative: 0 %
Eosinophils Absolute: 0.2 10*3/uL (ref 0.0–0.5)
Eosinophils Relative: 1 %
HCT: 37.8 % (ref 36.0–46.0)
Hemoglobin: 13.2 g/dL (ref 12.0–15.0)
Immature Granulocytes: 1 %
Lymphocytes Relative: 19 %
Lymphs Abs: 2.7 10*3/uL (ref 0.7–4.0)
MCH: 37.4 pg — ABNORMAL HIGH (ref 26.0–34.0)
MCHC: 34.9 g/dL (ref 30.0–36.0)
MCV: 107.1 fL — ABNORMAL HIGH (ref 80.0–100.0)
Monocytes Absolute: 1.4 10*3/uL — ABNORMAL HIGH (ref 0.1–1.0)
Monocytes Relative: 10 %
Neutro Abs: 9.3 10*3/uL — ABNORMAL HIGH (ref 1.7–7.7)
Neutrophils Relative %: 69 %
Platelets: 245 10*3/uL (ref 150–400)
RBC: 3.53 MIL/uL — ABNORMAL LOW (ref 3.87–5.11)
RDW: 13.6 % (ref 11.5–15.5)
WBC: 13.7 10*3/uL — ABNORMAL HIGH (ref 4.0–10.5)
nRBC: 0 % (ref 0.0–0.2)

## 2019-04-08 LAB — COMPREHENSIVE METABOLIC PANEL
ALT: 43 U/L (ref 0–44)
AST: 128 U/L — ABNORMAL HIGH (ref 15–41)
Albumin: 2.6 g/dL — ABNORMAL LOW (ref 3.5–5.0)
Alkaline Phosphatase: 195 U/L — ABNORMAL HIGH (ref 38–126)
Anion gap: 13 (ref 5–15)
BUN: 11 mg/dL (ref 6–20)
CO2: 23 mmol/L (ref 22–32)
Calcium: 9.1 mg/dL (ref 8.9–10.3)
Chloride: 95 mmol/L — ABNORMAL LOW (ref 98–111)
Creatinine, Ser: 1.05 mg/dL — ABNORMAL HIGH (ref 0.44–1.00)
GFR calc Af Amer: >60 mL/min (ref >60)
GFR calc non Af Amer: >60 mL/min (ref >60)
Glucose, Bld: 103 mg/dL — ABNORMAL HIGH (ref 70–99)
Potassium: 2.8 mmol/L — ABNORMAL LOW (ref 3.5–5.1)
Sodium: 131 mmol/L — ABNORMAL LOW (ref 135–145)
Total Bilirubin: 2 mg/dL — ABNORMAL HIGH (ref 0.3–1.2)
Total Protein: 7 g/dL (ref 6.5–8.1)

## 2019-04-08 LAB — URINALYSIS, COMPLETE (UACMP) WITH MICROSCOPIC
Bacteria, UA: NONE SEEN
Bilirubin Urine: NEGATIVE
Glucose, UA: NEGATIVE mg/dL
Hgb urine dipstick: NEGATIVE
Ketones, ur: NEGATIVE mg/dL
Leukocytes,Ua: NEGATIVE
Nitrite: NEGATIVE
Protein, ur: NEGATIVE mg/dL
Specific Gravity, Urine: 1.01 (ref 1.005–1.030)
pH: 5 (ref 5.0–8.0)

## 2019-04-08 LAB — PREGNANCY, URINE: Preg Test, Ur: NEGATIVE

## 2019-04-08 MED ORDER — IOHEXOL 300 MG/ML  SOLN
75.0000 mL | Freq: Once | INTRAMUSCULAR | Status: AC | PRN
  Administered 2019-04-08: 75 mL via INTRAVENOUS
  Filled 2019-04-08: qty 75

## 2019-04-08 MED ORDER — LIDOCAINE HCL (PF) 1 % IJ SOLN
5.0000 mL | Freq: Once | INTRAMUSCULAR | Status: DC
  Filled 2019-04-08: qty 5
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
## 2019-04-09 ENCOUNTER — Encounter: Payer: Self-pay | Admitting: Internal Medicine

## 2019-04-09 ENCOUNTER — Emergency Department: Payer: Self-pay

## 2019-04-09 ENCOUNTER — Observation Stay: Payer: Self-pay

## 2019-04-09 DIAGNOSIS — R911 Solitary pulmonary nodule: Secondary | ICD-10-CM | POA: Diagnosis present

## 2019-04-09 LAB — PROTEIN, PLEURAL OR PERITONEAL FLUID: Total protein, fluid: <3 g/dL

## 2019-04-09 LAB — BODY FLUID CELL COUNT WITH DIFFERENTIAL
Eos, Fluid: 0 %
Lymphs, Fluid: 14 %
Monocyte-Macrophage-Serous Fluid: 77 %
Neutrophil Count, Fluid: 9 %
Total Nucleated Cell Count, Fluid: 70 cu mm

## 2019-04-09 LAB — MAGNESIUM: Magnesium: 1.3 mg/dL — ABNORMAL LOW (ref 1.7–2.4)

## 2019-04-09 LAB — TSH: TSH: 3.206 u[IU]/mL (ref 0.350–4.500)

## 2019-04-09 LAB — ALBUMIN, PLEURAL OR PERITONEAL FLUID: Albumin, Fluid: <1 g/dL

## 2019-04-09 LAB — PATHOLOGIST SMEAR REVIEW

## 2019-04-09 LAB — SARS CORONAVIRUS 2 BY RT PCR (HOSPITAL ORDER, PERFORMED IN ~~LOC~~ HOSPITAL LAB): SARS Coronavirus 2: NEGATIVE

## 2019-04-09 MED ORDER — SODIUM CHLORIDE 0.9% FLUSH
10.0000 mL | Freq: Two times a day (BID) | INTRAVENOUS | Status: DC
  Administered 2019-04-09 – 2019-04-10 (×3): 10 mL via INTRAVENOUS

## 2019-04-09 MED ORDER — ACETAMINOPHEN 325 MG PO TABS
650.0000 mg | ORAL_TABLET | Freq: Once | ORAL | Status: AC
  Administered 2019-04-09: 650 mg via ORAL
  Filled 2019-04-09: qty 2

## 2019-04-09 MED ORDER — SERTRALINE HCL 50 MG PO TABS
50.0000 mg | ORAL_TABLET | Freq: Every day | ORAL | Status: DC
  Administered 2019-04-09 – 2019-04-10 (×2): 50 mg via ORAL
  Filled 2019-04-09 (×2): qty 1

## 2019-04-09 MED ORDER — PHENOL 1.4 % MT LIQD
1.0000 | OROMUCOSAL | Status: DC | PRN
  Filled 2019-04-09: qty 177

## 2019-04-09 MED ORDER — TRAMADOL HCL 50 MG PO TABS
50.0000 mg | ORAL_TABLET | Freq: Four times a day (QID) | ORAL | Status: DC | PRN
  Administered 2019-04-09 (×2): 50 mg via ORAL
  Filled 2019-04-09 (×2): qty 1

## 2019-04-09 MED ORDER — PNEUMOCOCCAL 13-VAL CONJ VACC IM SUSP: 0.5000 mL | INTRAMUSCULAR | Status: DC

## 2019-04-09 MED ORDER — FUROSEMIDE 20 MG PO TABS
20.0000 mg | ORAL_TABLET | Freq: Every day | ORAL | Status: DC
  Administered 2019-04-09 – 2019-04-10 (×2): 20 mg via ORAL
  Filled 2019-04-09 (×2): qty 1

## 2019-04-09 MED ORDER — FENTANYL CITRATE (PF) 100 MCG/2ML IJ SOLN
50.0000 ug | Freq: Once | INTRAMUSCULAR | Status: AC
  Administered 2019-04-09: 50 ug via INTRAVENOUS
  Filled 2019-04-09: qty 2

## 2019-04-09 MED ORDER — MAGNESIUM SULFATE 4 GM/100ML IV SOLN
4.0000 g | Freq: Once | INTRAVENOUS | Status: AC
  Administered 2019-04-09: 4 g via INTRAVENOUS
  Filled 2019-04-09 (×2): qty 100

## 2019-04-09 MED ORDER — LACTULOSE 10 GM/15ML PO SOLN
30.0000 g | Freq: Every day | ORAL | Status: DC
  Administered 2019-04-09 – 2019-04-10 (×2): 30 g via ORAL
  Filled 2019-04-09 (×2): qty 60

## 2019-04-09 MED ORDER — TRAZODONE HCL 100 MG PO TABS
300.0000 mg | ORAL_TABLET | Freq: Every evening | ORAL | Status: DC | PRN
  Administered 2019-04-09: 300 mg via ORAL
  Filled 2019-04-09: qty 3

## 2019-04-09 MED ORDER — BUPROPION HCL ER (XL) 150 MG PO TB24
300.0000 mg | ORAL_TABLET | Freq: Every day | ORAL | Status: DC
  Administered 2019-04-09 – 2019-04-10 (×2): 300 mg via ORAL
  Filled 2019-04-09 (×2): qty 2

## 2019-04-09 MED ORDER — MORPHINE SULFATE (PF) 2 MG/ML IV SOLN
2.0000 mg | Freq: Four times a day (QID) | INTRAVENOUS | Status: DC | PRN
  Administered 2019-04-09: 2 mg via INTRAVENOUS
  Filled 2019-04-09 (×2): qty 1

## 2019-04-09 MED ORDER — DOCUSATE SODIUM 100 MG PO CAPS
100.0000 mg | ORAL_CAPSULE | Freq: Two times a day (BID) | ORAL | Status: DC
  Administered 2019-04-10: 100 mg via ORAL
  Filled 2019-04-09 (×2): qty 1

## 2019-04-09 MED ORDER — ONDANSETRON HCL 4 MG/2ML IJ SOLN
4.0000 mg | Freq: Four times a day (QID) | INTRAMUSCULAR | Status: DC | PRN
  Administered 2019-04-09: 4 mg via INTRAVENOUS
  Filled 2019-04-09: qty 2

## 2019-04-09 MED ORDER — POTASSIUM CHLORIDE 10 MEQ/100ML IV SOLN
10.0000 meq | INTRAVENOUS | Status: AC
  Administered 2019-04-09 (×2): 10 meq via INTRAVENOUS
  Filled 2019-04-09 (×2): qty 100

## 2019-04-09 MED ORDER — PNEUMOCOCCAL VAC POLYVALENT 25 MCG/0.5ML IJ INJ: 0.5000 mL | INJECTION | INTRAMUSCULAR | Status: DC

## 2019-04-09 MED ORDER — GABAPENTIN 300 MG PO CAPS
900.0000 mg | ORAL_CAPSULE | Freq: Three times a day (TID) | ORAL | Status: DC
  Administered 2019-04-09 – 2019-04-10 (×3): 900 mg via ORAL
  Filled 2019-04-09 (×3): qty 3

## 2019-04-09 MED ORDER — ONDANSETRON HCL 4 MG PO TABS: 4.0000 mg | ORAL_TABLET | Freq: Four times a day (QID) | ORAL | Status: DC | PRN

## 2019-04-09 MED ORDER — SPIRONOLACTONE 25 MG PO TABS
50.0000 mg | ORAL_TABLET | Freq: Every day | ORAL | Status: DC
  Administered 2019-04-09 – 2019-04-10 (×2): 50 mg via ORAL
  Filled 2019-04-09 (×2): qty 2

## 2019-04-09 MED ORDER — POTASSIUM CHLORIDE IN NACL 40-0.9 MEQ/L-% IV SOLN
INTRAVENOUS | Status: DC
  Administered 2019-04-09 – 2019-04-10 (×3): 100 mL/h via INTRAVENOUS
  Filled 2019-04-09 (×6): qty 1000

## 2019-04-09 NOTE — Progress Notes (Signed)
Ch f/u with pt for prayer and see if she was able to tolerate food. Pt shared that she was successfully able to hv 3 L removed from her stomach and was feeling a lot better now. Pt is hopeful that she can get a liver transplant with the help of charity. Ch is a ware that this is a process and provided words of encouragement for pt. Ch offered pt prayer and read Col 3:15 to be a reminder to rejoice about the little victories that she has experienced today regarding her health. Pt was appreciative for the support.    04/09/19 1400  Clinical Encounter Type  Visited With Patient  Visit Type Social support  Spiritual Encounters  Spiritual Needs Prayer;Emotional  Stress Factors  Patient Stress Factors Health changes;Major life changes;Loss of control  Family Stress Factors None identified
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
## 2019-04-09 NOTE — Progress Notes (Signed)
Pt complain of pain. MD made aware. New orders placed for pain med. RN will continue to monitor.

## 2019-04-09 NOTE — Plan of Care (Signed)
  Problem: Education: Goal: Knowledge of General Education information will improve Description: Including pain rating scale, medication(s)/side effects and non-pharmacologic comfort measures Outcome: Progressing   Problem: Health Behavior/Discharge Planning: Goal: Ability to manage health-related needs will improve Outcome: Progressing   Problem: Clinical Measurements: Goal: Will remain free from infection Outcome: Progressing Goal: Respiratory complications will improve Outcome: Progressing Goal: Cardiovascular complication will be avoided Outcome: Progressing   Problem: Safety: Goal: Ability to remain free from injury will improve Outcome: Progressing   
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
## 2019-04-09 NOTE — Plan of Care (Signed)
  Problem: Education: Goal: Knowledge of General Education information will improve Description: Including pain rating scale, medication(s)/side effects and non-pharmacologic comfort measures Outcome: Progressing   Problem: Safety: Goal: Ability to remain free from injury will improve Outcome: Progressing   Problem: Skin Integrity: Goal: Risk for impaired skin integrity will decrease Outcome: Progressing   
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
## 2019-04-10 ENCOUNTER — Ambulatory Visit
Admission: RE | Admit: 2019-04-10 | Discharge: 2019-04-10 | Disposition: A | Discharge status: Home / Self Care | Payer: Self-pay | Source: Ambulatory Visit | Attending: Student | Admitting: Student

## 2019-04-10 LAB — BASIC METABOLIC PANEL
Anion gap: 1 — ABNORMAL LOW (ref 5–15)
BUN: 10 mg/dL (ref 6–20)
CO2: 24 mmol/L (ref 22–32)
Calcium: 7.4 mg/dL — ABNORMAL LOW (ref 8.9–10.3)
Chloride: 109 mmol/L (ref 98–111)
Creatinine, Ser: 0.91 mg/dL (ref 0.44–1.00)
GFR calc Af Amer: >60 mL/min (ref >60)
GFR calc non Af Amer: >60 mL/min (ref >60)
Glucose, Bld: 120 mg/dL — ABNORMAL HIGH (ref 70–99)
Potassium: 4.4 mmol/L (ref 3.5–5.1)
Sodium: 134 mmol/L — ABNORMAL LOW (ref 135–145)

## 2019-04-10 LAB — MAGNESIUM: Magnesium: 2.2 mg/dL (ref 1.7–2.4)

## 2019-04-10 LAB — PH, BODY FLUID: pH, Body Fluid: 7.5

## 2019-04-10 LAB — PROTEIN, BODY FLUID (OTHER): Total Protein, Body Fluid Other: 0.4 g/dL
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
## 2019-04-11 LAB — QUANTIFERON-TB GOLD PLUS: QuantiFERON-TB Gold Plus: NEGATIVE

## 2019-04-11 LAB — QUANTIFERON-TB GOLD PLUS (RQFGPL)
QuantiFERON Mitogen Value: 4.56 IU/mL
QuantiFERON Nil Value: 0.03 IU/mL
QuantiFERON TB1 Ag Value: 0.02 IU/mL
QuantiFERON TB2 Ag Value: 0.02 IU/mL

## 2019-04-13 LAB — BODY FLUID CULTURE: Culture: NO GROWTH

## 2019-04-14 LAB — CULTURE, BLOOD (ROUTINE X 2)
Culture: NO GROWTH
Culture: NO GROWTH
Special Requests: ADEQUATE

## 2019-04-17 ENCOUNTER — Inpatient Hospital Stay
Admission: EM | Admit: 2019-04-17 | Discharge: 2019-04-19 | DRG: 377 | Disposition: A | Discharge status: Home / Self Care | Payer: Self-pay | Attending: Internal Medicine | Admitting: Internal Medicine

## 2019-04-17 ENCOUNTER — Other Ambulatory Visit: Payer: Self-pay

## 2019-04-17 DIAGNOSIS — J984 Other disorders of lung: Secondary | ICD-10-CM

## 2019-04-17 DIAGNOSIS — B182 Chronic viral hepatitis C: Secondary | ICD-10-CM | POA: Diagnosis present

## 2019-04-17 DIAGNOSIS — R188 Other ascites: Secondary | ICD-10-CM

## 2019-04-17 DIAGNOSIS — Z85828 Personal history of other malignant neoplasm of skin: Secondary | ICD-10-CM

## 2019-04-17 DIAGNOSIS — J189 Pneumonia, unspecified organism: Secondary | ICD-10-CM

## 2019-04-17 DIAGNOSIS — R571 Hypovolemic shock: Secondary | ICD-10-CM | POA: Diagnosis present

## 2019-04-17 DIAGNOSIS — J45909 Unspecified asthma, uncomplicated: Secondary | ICD-10-CM | POA: Diagnosis present

## 2019-04-17 DIAGNOSIS — F1020 Alcohol dependence, uncomplicated: Secondary | ICD-10-CM | POA: Diagnosis present

## 2019-04-17 DIAGNOSIS — K7031 Alcoholic cirrhosis of liver with ascites: Secondary | ICD-10-CM | POA: Diagnosis present

## 2019-04-17 DIAGNOSIS — M5137 Other intervertebral disc degeneration, lumbosacral region: Secondary | ICD-10-CM | POA: Diagnosis present

## 2019-04-17 DIAGNOSIS — K7210 Chronic hepatic failure without coma: Secondary | ICD-10-CM | POA: Diagnosis present

## 2019-04-17 DIAGNOSIS — F17210 Nicotine dependence, cigarettes, uncomplicated: Secondary | ICD-10-CM | POA: Diagnosis present

## 2019-04-17 DIAGNOSIS — F329 Major depressive disorder, single episode, unspecified: Secondary | ICD-10-CM | POA: Diagnosis present

## 2019-04-17 DIAGNOSIS — T502X5A Adverse effect of carbonic-anhydrase inhibitors, benzothiadiazides and other diuretics, initial encounter: Secondary | ICD-10-CM | POA: Diagnosis present

## 2019-04-17 DIAGNOSIS — Z79899 Other long term (current) drug therapy: Secondary | ICD-10-CM

## 2019-04-17 DIAGNOSIS — I81 Portal vein thrombosis: Secondary | ICD-10-CM | POA: Diagnosis present

## 2019-04-17 DIAGNOSIS — I959 Hypotension, unspecified: Secondary | ICD-10-CM | POA: Diagnosis present

## 2019-04-17 DIAGNOSIS — Z20828 Contact with and (suspected) exposure to other viral communicable diseases: Secondary | ICD-10-CM | POA: Diagnosis present

## 2019-04-17 DIAGNOSIS — Z8249 Family history of ischemic heart disease and other diseases of the circulatory system: Secondary | ICD-10-CM

## 2019-04-17 DIAGNOSIS — I10 Essential (primary) hypertension: Secondary | ICD-10-CM | POA: Diagnosis present

## 2019-04-17 DIAGNOSIS — K922 Gastrointestinal hemorrhage, unspecified: Principal | ICD-10-CM | POA: Diagnosis present

## 2019-04-17 DIAGNOSIS — R2681 Unsteadiness on feet: Secondary | ICD-10-CM | POA: Diagnosis present

## 2019-04-17 DIAGNOSIS — Z882 Allergy status to sulfonamides status: Secondary | ICD-10-CM

## 2019-04-17 DIAGNOSIS — K921 Melena: Secondary | ICD-10-CM

## 2019-04-17 DIAGNOSIS — R531 Weakness: Secondary | ICD-10-CM

## 2019-04-17 LAB — POCT PREGNANCY, URINE: Preg Test, Ur: NEGATIVE

## 2019-04-17 LAB — URINE DRUG SCREEN, QUALITATIVE (ARMC ONLY)
Amphetamines, Ur Screen: NOT DETECTED
Barbiturates, Ur Screen: NOT DETECTED
Benzodiazepine, Ur Scrn: POSITIVE — AB
Cannabinoid 50 Ng, Ur ~~LOC~~: NOT DETECTED
Cocaine Metabolite,Ur ~~LOC~~: NOT DETECTED
MDMA (Ecstasy)Ur Screen: NOT DETECTED
Methadone Scn, Ur: NOT DETECTED
Opiate, Ur Screen: NOT DETECTED
Phencyclidine (PCP) Ur S: NOT DETECTED
Tricyclic, Ur Screen: NOT DETECTED

## 2019-04-17 LAB — CBC
HCT: 34.6 % — ABNORMAL LOW (ref 36.0–46.0)
HCT: 34.9 % — ABNORMAL LOW (ref 36.0–46.0)
Hemoglobin: 11.6 g/dL — ABNORMAL LOW (ref 12.0–15.0)
Hemoglobin: 12.1 g/dL (ref 12.0–15.0)
MCH: 36.5 pg — ABNORMAL HIGH (ref 26.0–34.0)
MCH: 36.8 pg — ABNORMAL HIGH (ref 26.0–34.0)
MCHC: 33.5 g/dL (ref 30.0–36.0)
MCHC: 34.7 g/dL (ref 30.0–36.0)
MCV: 106.1 fL — ABNORMAL HIGH (ref 80.0–100.0)
MCV: 108.8 fL — ABNORMAL HIGH (ref 80.0–100.0)
Platelets: 220 10*3/uL (ref 150–400)
Platelets: 268 10*3/uL (ref 150–400)
RBC: 3.18 MIL/uL — ABNORMAL LOW (ref 3.87–5.11)
RBC: 3.29 MIL/uL — ABNORMAL LOW (ref 3.87–5.11)
RDW: 13.6 % (ref 11.5–15.5)
RDW: 13.9 % (ref 11.5–15.5)
WBC: 7.2 10*3/uL (ref 4.0–10.5)
WBC: 8.8 10*3/uL (ref 4.0–10.5)
nRBC: 0 % (ref 0.0–0.2)
nRBC: 0 % (ref 0.0–0.2)

## 2019-04-17 LAB — URINALYSIS, COMPLETE (UACMP) WITH MICROSCOPIC
Glucose, UA: NEGATIVE mg/dL
Ketones, ur: NEGATIVE mg/dL
Nitrite: NEGATIVE
Protein, ur: 30 mg/dL — AB
Specific Gravity, Urine: 1.021 (ref 1.005–1.030)
Squamous Epithelial / HPF: >50 — ABNORMAL HIGH (ref 0–5)
pH: 5 (ref 5.0–8.0)

## 2019-04-17 LAB — AMMONIA: Ammonia: 37 umol/L — ABNORMAL HIGH (ref 9–35)

## 2019-04-17 LAB — BASIC METABOLIC PANEL
Anion gap: 5 (ref 5–15)
BUN: 7 mg/dL (ref 6–20)
CO2: 23 mmol/L (ref 22–32)
Calcium: 8.6 mg/dL — ABNORMAL LOW (ref 8.9–10.3)
Chloride: 105 mmol/L (ref 98–111)
Creatinine, Ser: 0.85 mg/dL (ref 0.44–1.00)
GFR calc Af Amer: >60 mL/min (ref >60)
GFR calc non Af Amer: >60 mL/min (ref >60)
Glucose, Bld: 88 mg/dL (ref 70–99)
Potassium: 3.8 mmol/L (ref 3.5–5.1)
Sodium: 133 mmol/L — ABNORMAL LOW (ref 135–145)

## 2019-04-17 LAB — MAGNESIUM: Magnesium: 1.6 mg/dL — ABNORMAL LOW (ref 1.7–2.4)

## 2019-04-17 LAB — HEPATIC FUNCTION PANEL
ALT: 36 U/L (ref 0–44)
AST: 97 U/L — ABNORMAL HIGH (ref 15–41)
Albumin: 2.4 g/dL — ABNORMAL LOW (ref 3.5–5.0)
Alkaline Phosphatase: 151 U/L — ABNORMAL HIGH (ref 38–126)
Bilirubin, Direct: 0.6 mg/dL — ABNORMAL HIGH (ref 0.0–0.2)
Indirect Bilirubin: 0.8 mg/dL (ref 0.3–0.9)
Total Bilirubin: 1.4 mg/dL — ABNORMAL HIGH (ref 0.3–1.2)
Total Protein: 6.1 g/dL — ABNORMAL LOW (ref 6.5–8.1)

## 2019-04-17 LAB — SARS CORONAVIRUS 2 BY RT PCR (HOSPITAL ORDER, PERFORMED IN ~~LOC~~ HOSPITAL LAB): SARS Coronavirus 2: NEGATIVE

## 2019-04-17 LAB — PROTIME-INR
INR: 1.3 — ABNORMAL HIGH (ref 0.8–1.2)
Prothrombin Time: 15.9 seconds — ABNORMAL HIGH (ref 11.4–15.2)

## 2019-04-17 LAB — TYPE AND SCREEN
ABO/RH(D): O POS
Antibody Screen: NEGATIVE

## 2019-04-17 LAB — ETHANOL: Alcohol, Ethyl (B): <10 mg/dL (ref <10)

## 2019-04-17 MED ORDER — SODIUM CHLORIDE 0.9 % IV SOLN
80.0000 mg | Freq: Once | INTRAVENOUS | Status: AC
  Administered 2019-04-17: 80 mg via INTRAVENOUS
  Filled 2019-04-17 (×2): qty 80

## 2019-04-17 MED ORDER — MAGNESIUM SULFATE 2 GM/50ML IV SOLN
2.0000 g | Freq: Once | INTRAVENOUS | Status: AC
  Administered 2019-04-17: 2 g via INTRAVENOUS
  Filled 2019-04-17: qty 50

## 2019-04-17 MED ORDER — LORAZEPAM 2 MG/ML IJ SOLN
1.0000 mg | Freq: Once | INTRAMUSCULAR | Status: AC
  Administered 2019-04-17: 1 mg via INTRAVENOUS
  Filled 2019-04-17: qty 1

## 2019-04-17 MED ORDER — TRAZODONE HCL 50 MG PO TABS: 300.0000 mg | ORAL_TABLET | Freq: Every evening | ORAL | Status: DC | PRN

## 2019-04-17 MED ORDER — SODIUM CHLORIDE 0.9 % IV SOLN
INTRAVENOUS | Status: DC
  Administered 2019-04-17 – 2019-04-18 (×2): via INTRAVENOUS

## 2019-04-17 MED ORDER — SODIUM CHLORIDE 0.9 % IV BOLUS
500.0000 mL | Freq: Once | INTRAVENOUS | Status: AC
  Administered 2019-04-17: 500 mL via INTRAVENOUS

## 2019-04-17 MED ORDER — PANTOPRAZOLE SODIUM 40 MG IV SOLR: 40.0000 mg | Freq: Two times a day (BID) | INTRAVENOUS | Status: DC

## 2019-04-17 MED ORDER — SODIUM CHLORIDE 0.9 % IV SOLN
1.0000 g | INTRAVENOUS | Status: DC
  Filled 2019-04-17: qty 10

## 2019-04-17 MED ORDER — SODIUM CHLORIDE 0.9 % IV SOLN
Freq: Once | INTRAVENOUS | Status: AC
  Administered 2019-04-17: 15:00:00 via INTRAVENOUS

## 2019-04-17 MED ORDER — ACETAMINOPHEN 325 MG PO TABS: 650.0000 mg | ORAL_TABLET | Freq: Four times a day (QID) | ORAL | Status: DC | PRN

## 2019-04-17 MED ORDER — GABAPENTIN 300 MG PO CAPS
900.0000 mg | ORAL_CAPSULE | Freq: Three times a day (TID) | ORAL | Status: DC
  Administered 2019-04-17 – 2019-04-19 (×5): 900 mg via ORAL
  Filled 2019-04-17 (×5): qty 3

## 2019-04-17 MED ORDER — ONDANSETRON HCL 4 MG/2ML IJ SOLN: 4.0000 mg | Freq: Four times a day (QID) | INTRAMUSCULAR | Status: DC | PRN

## 2019-04-17 MED ORDER — ONDANSETRON HCL 4 MG PO TABS: 4.0000 mg | ORAL_TABLET | Freq: Four times a day (QID) | ORAL | Status: DC | PRN

## 2019-04-17 MED ORDER — RIFAXIMIN 550 MG PO TABS
550.0000 mg | ORAL_TABLET | Freq: Two times a day (BID) | ORAL | Status: DC
  Administered 2019-04-17 – 2019-04-19 (×4): 550 mg via ORAL
  Filled 2019-04-17 (×6): qty 1

## 2019-04-17 MED ORDER — THIAMINE HCL 100 MG/ML IJ SOLN
100.0000 mg | Freq: Every day | INTRAMUSCULAR | Status: DC
  Administered 2019-04-18 – 2019-04-19 (×2): 100 mg via INTRAVENOUS
  Filled 2019-04-17 (×2): qty 2

## 2019-04-17 MED ORDER — SODIUM CHLORIDE 0.9 % IV SOLN
8.0000 mg/h | INTRAVENOUS | Status: DC
  Administered 2019-04-17 – 2019-04-18 (×2): 8 mg/h via INTRAVENOUS
  Filled 2019-04-17: qty 80

## 2019-04-17 MED ORDER — SERTRALINE HCL 50 MG PO TABS
50.0000 mg | ORAL_TABLET | Freq: Every day | ORAL | Status: DC
  Administered 2019-04-18 – 2019-04-19 (×2): 50 mg via ORAL
  Filled 2019-04-17 (×2): qty 1

## 2019-04-17 MED ORDER — SODIUM CHLORIDE 0.9 % IV SOLN
1.0000 g | Freq: Once | INTRAVENOUS | Status: AC
  Administered 2019-04-17: 1 g via INTRAVENOUS
  Filled 2019-04-17: qty 10

## 2019-04-17 MED ORDER — MIDODRINE HCL 5 MG PO TABS
5.0000 mg | ORAL_TABLET | Freq: Three times a day (TID) | ORAL | Status: DC
  Filled 2019-04-17 (×2): qty 1

## 2019-04-17 MED ORDER — ACETAMINOPHEN 650 MG RE SUPP: 650.0000 mg | Freq: Four times a day (QID) | RECTAL | Status: DC | PRN

## 2019-04-17 MED ORDER — SODIUM CHLORIDE 0.9 % IV SOLN
50.0000 ug/h | INTRAVENOUS | Status: DC
  Administered 2019-04-17 – 2019-04-18 (×2): 50 ug/h via INTRAVENOUS
  Filled 2019-04-17 (×5): qty 1

## 2019-04-17 MED ORDER — LACTULOSE 10 GM/15ML PO SOLN
30.0000 g | Freq: Every day | ORAL | Status: DC
  Administered 2019-04-18 – 2019-04-19 (×2): 30 g via ORAL
  Filled 2019-04-17 (×2): qty 60

## 2019-04-17 MED ORDER — THIAMINE HCL 100 MG/ML IJ SOLN
100.0000 mg | Freq: Once | INTRAMUSCULAR | Status: AC
  Administered 2019-04-17: 100 mg via INTRAVENOUS
  Filled 2019-04-17: qty 2

## 2019-04-17 MED ORDER — BUPROPION HCL ER (XL) 150 MG PO TB24
300.0000 mg | ORAL_TABLET | Freq: Every day | ORAL | Status: DC
  Administered 2019-04-18 – 2019-04-19 (×2): 300 mg via ORAL
  Filled 2019-04-17: qty 1
  Filled 2019-04-17: qty 2

## 2019-04-17 MED ORDER — ONDANSETRON HCL 4 MG/2ML IJ SOLN
4.0000 mg | Freq: Once | INTRAMUSCULAR | Status: AC
  Administered 2019-04-17: 4 mg via INTRAVENOUS
  Filled 2019-04-17: qty 2

## 2019-04-17 NOTE — ED Provider Notes (Signed)
Clinical Course as of Apr 17 1723  Wed Apr 17, 2019  1704 Patient unable to ambulate.  Having significant tremor and asterixis.  Possible hepatic encephalopathy but ammonia is improving however clinically appears consistent with such.  Has been on heavy diuretics now with low blood pressure.  Will give bolus of IV fluid.  Anticipate patient will require admission the hospital.   [PR]  1723 Case was discussed in consultation with Dr. Marius Ditch of GI.  Recommends octreotide as well as PPI infusion and Rocephin for SBP prophylaxis.  Will admit to stepdown for close hemodynamic monitoring.  If she drops her pressure again will consider endoscopy this evening.   [PR]    Clinical Course User Index [PR] Merlyn Lot, MD      Merlyn Lot, MD 04/17/19 1724

## 2019-04-17 NOTE — ED Notes (Signed)
ED TO INPATIENT HANDOFF REPORT  ED Nurse Name and Phone #: Kieffer Blatz [phone]  S Name/Age/Gender Rise Patience 43 y.o. female Room/Bed: ED25A/ED25A  Code Status   Code Status: Prior  Home/SNF/Other Home Patient oriented to: self, place, time and situation Is this baseline? Yes   Triage Complete: Triage complete  Chief Complaint dizziness, vomiting  Triage Note Reports that she was recently discharged from hospital but has been feeling lethargic and confused X 6 days. Dx with cirrhosis of liver recently.   Allergies Allergies  Allergen Reactions  . Sulfa Antibiotics Other (See Comments)    Reaction: unknown    Level of Care/Admitting Diagnosis ED Disposition    ED Disposition Condition [facility]: [place] [[id number]]  Level of Care: Med-Surg [16]  Covid Evaluation: Asymptomatic Screening Protocol (No Symptoms)  Diagnosis: Hypotension [295621]  Admitting Physician: Dustin Flock [[id number]]  Attending Physician: Dustin Flock [[id number]]  Estimated length of stay: past midnight tomorrow  Certification:: I certify this patient will need inpatient services for at least 2 midnights  PT Class (Do Not Modify): Inpatient [101]  PT Acc Code (Do Not Modify): Private [1]       B Medical/Surgery History Past Medical History:  Diagnosis Date  . Alcohol dependence ([place])   . Asthma   . Cancer ([place])    left shoulder melonoma with excision  . DDD (degenerative disc disease), lumbosacral   . Hepatitis C   . Hypertension   . Renal disorder    Past Surgical History:  Procedure Laterality Date  . cancer removal       A IV Location/Drains/Wounds Patient Lines/Drains/Airways Status   Active Line/Drains/Airways    Name:   Placement date:   Placement time:   Site:   Days:   Peripheral IV 04/17/19 Left Antecubital   04/17/19    1440    Antecubital   less than 1          Intake/Output Last 24 hours No intake or output data in the  24 hours ending 04/17/19 1835  Labs/Imaging Results for orders placed or performed during the hospital encounter of 04/17/19 (from the past 48 hour(s))  Basic metabolic panel     Status: Abnormal   Collection Time: 04/17/19  1:37 PM  Result Value Ref Range   Sodium 133 (L) 135 - 145 mmol/L   Potassium 3.8 3.5 - 5.1 mmol/L   Chloride 105 98 - 111 mmol/L   CO2 23 22 - 32 mmol/L   Glucose, Bld 88 70 - 99 mg/dL   BUN 7 6 - 20 mg/dL   Creatinine, Ser 0.85 0.44 - 1.00 mg/dL   Calcium 8.6 (L) 8.9 - 10.3 mg/dL   GFR calc non Af Amer >60 >60 mL/min   GFR calc Af Amer >60 >60 mL/min   Anion gap 5 5 - 15    Comment: Performed at [facility], [address] [place], [place] [address]  CBC     Status: Abnormal   Collection Time: 04/17/19  1:37 PM  Result Value Ref Range   WBC 8.8 4.0 - 10.5 K/uL   RBC 3.29 (L) 3.87 - 5.11 MIL/uL   Hemoglobin 12.1 12.0 - 15.0 g/dL   HCT 34.9 (L) 36.0 - 46.0 %   MCV 106.1 (H) 80.0 - 100.0 fL   MCH 36.8 (H) 26.0 - 34.0 pg   MCHC 34.7 30.0 - 36.0 g/dL   RDW 13.6 11.5 - 15.5 %  Platelets 268 150 - 400 K/uL   nRBC 0.0 0.0 - 0.2 %    Comment: Performed at [facility], [address] [place], [place] [address]  Hepatic function panel     Status: Abnormal   Collection Time: 04/17/19  1:37 PM  Result Value Ref Range   Total Protein 6.1 (L) 6.5 - 8.1 g/dL   Albumin 2.4 (L) 3.5 - 5.0 g/dL   AST 97 (H) 15 - 41 U/L   ALT 36 0 - 44 U/L   Alkaline Phosphatase 151 (H) 38 - 126 U/L   Total Bilirubin 1.4 (H) 0.3 - 1.2 mg/dL   Bilirubin, Direct 0.6 (H) 0.0 - 0.2 mg/dL   Indirect Bilirubin 0.8 0.3 - 0.9 mg/dL    Comment: Performed at [facility], [address] [place], [place] [address]  Urinalysis, Complete w Microscopic     Status: Abnormal   Collection Time: 04/17/19  2:28 PM  Result Value Ref Range   Color, Urine AMBER (A) YELLOW    Comment: BIOCHEMICALS MAY BE AFFECTED BY COLOR   APPearance CLOUDY (A) CLEAR   Specific  Gravity, Urine 1.021 1.005 - 1.030   pH 5.0 5.0 - 8.0   Glucose, UA NEGATIVE NEGATIVE mg/dL   Hgb urine dipstick SMALL (A) NEGATIVE   Bilirubin Urine SMALL (A) NEGATIVE   Ketones, ur NEGATIVE NEGATIVE mg/dL   Protein, ur 30 (A) NEGATIVE mg/dL   Nitrite NEGATIVE NEGATIVE   Leukocytes,Ua MODERATE (A) NEGATIVE   RBC / HPF 21-50 0 - 5 RBC/hpf   WBC, UA 21-50 0 - 5 WBC/hpf   Bacteria, UA FEW (A) NONE SEEN   Squamous Epithelial / LPF >50 (H) 0 - 5   Mucus PRESENT    Hyaline Casts, UA PRESENT    Non Squamous Epithelial PRESENT (A) NONE SEEN    Comment: Performed at [facility], [address] [place], [place] [address]  Ammonia     Status: Abnormal   Collection Time: 04/17/19  2:28 PM  Result Value Ref Range   Ammonia 37 (H) 9 - 35 umol/L    Comment: Performed at [facility], [address] [place], [place] [address]  Protime-INR     Status: Abnormal   Collection Time: 04/17/19  2:28 PM  Result Value Ref Range   Prothrombin Time 15.9 (H) 11.4 - 15.2 seconds   INR 1.3 (H) 0.8 - 1.2    Comment: (NOTE) INR goal varies based on device and disease states. Performed at [facility], [address] [place], [place] [address]   Ethanol     Status: None   Collection Time: 04/17/19  2:28 PM  Result Value Ref Range   Alcohol, Ethyl (B) <10 <10 mg/dL    Comment: (NOTE) Lowest detectable limit for serum alcohol is 10 mg/dL. For medical purposes only. Performed at [facility], [address] [place], [place] [address]   Magnesium     Status: Abnormal   Collection Time: 04/17/19  2:28 PM  Result Value Ref Range   Magnesium 1.6 (L) 1.7 - 2.4 mg/dL    Comment: Performed at [facility], [address] [place], [place] [address]  Pregnancy, urine POC     Status: None   Collection Time: 04/17/19  2:38 PM  Result Value Ref Range   Preg Test, Ur NEGATIVE NEGATIVE    Comment:        THE SENSITIVITY OF THIS METHODOLOGY IS >24 mIU/mL  Urine Drug Screen, Qualitative ([facility] only)     Status: Abnormal   Collection Time: 04/17/19  3:34 PM  Result Value Ref Range   Tricyclic, Ur Screen NONE DETECTED NONE DETECTED   Amphetamines, Ur Screen NONE DETECTED NONE DETECTED   MDMA (Ecstasy)Ur Screen NONE DETECTED NONE DETECTED   Cocaine Metabolite,Ur [place] NONE DETECTED NONE DETECTED   Opiate, Ur Screen NONE DETECTED NONE DETECTED   Phencyclidine (PCP) Ur S NONE DETECTED NONE DETECTED   Cannabinoid 50 Ng, Ur [place] NONE DETECTED NONE DETECTED   Barbiturates, Ur Screen NONE DETECTED NONE DETECTED   Benzodiazepine, Ur Scrn POSITIVE (A) NONE DETECTED   Methadone Scn, Ur NONE DETECTED NONE DETECTED    Comment: (NOTE) Tricyclics + metabolites, urine    Cutoff 1000 ng/mL Amphetamines + metabolites, urine  Cutoff 1000 ng/mL MDMA (Ecstasy), urine              Cutoff 500 ng/mL Cocaine Metabolite, urine          Cutoff 300 ng/mL Opiate + metabolites, urine        Cutoff 300 ng/mL Phencyclidine (PCP), urine         Cutoff 25 ng/mL Cannabinoid, urine                 Cutoff 50 ng/mL Barbiturates + metabolites, urine  Cutoff 200 ng/mL Benzodiazepine, urine              Cutoff 200 ng/mL Methadone, urine                   Cutoff 300 ng/mL The urine drug screen provides only a preliminary, unconfirmed analytical test result and should not be used for non-medical purposes. Clinical consideration and professional judgment should be applied to any positive drug screen result due to possible interfering substances. A more specific alternate chemical method must be used in order to obtain a confirmed analytical result. Gas chromatography / mass spectrometry (GC/MS) is the preferred confirmat ory method. Performed at [facility], [address] [place], [place] [address]   SARS Coronavirus 2 [facility] order, Performed in [facility] hospital lab) Nasopharyngeal Nasopharyngeal Swab     Status: None   Collection Time: 04/17/19  4:46 PM    Specimen: Nasopharyngeal Swab  Result Value Ref Range   SARS Coronavirus 2 NEGATIVE NEGATIVE    Comment: (NOTE) If result is NEGATIVE SARS-CoV-2 target nucleic acids are NOT DETECTED. The SARS-CoV-2 RNA is generally detectable in upper and lower  respiratory specimens during the acute phase of infection. The lowest  concentration of SARS-CoV-2 viral copies this assay can detect is 250  copies / mL. A negative result does not preclude SARS-CoV-2 infection  and should not be used as the sole basis for treatment or other  patient management decisions.  A negative result may occur with  improper specimen collection / handling, submission of specimen other  than nasopharyngeal swab, presence of viral mutation(s) within the  areas targeted by this assay, and inadequate number of viral copies  (<250 copies / mL). A negative result must be combined with clinical  observations, patient history, and epidemiological information. If result is POSITIVE SARS-CoV-2 target nucleic acids are DETECTED. The SARS-CoV-2 RNA is generally detectable in upper and lower  respiratory specimens dur ing the acute phase of infection.  Positive  results are indicative of active infection with SARS-CoV-2.  Clinical  correlation with patient history and other diagnostic information is  necessary to determine patient infection status.  Positive results do  not rule out bacterial infection or co-infection with other viruses. If result is PRESUMPTIVE POSTIVE SARS-CoV-2 nucleic acids MAY BE PRESENT.   A presumptive positive result was obtained on the submitted specimen  and confirmed on repeat testing.  While 2019 novel coronavirus  (SARS-CoV-2) nucleic acids may be present in the submitted sample  additional confirmatory testing may be necessary for epidemiological  and / or clinical management purposes  to differentiate between  SARS-CoV-2 and other Sarbecovirus currently known to infect humans.  If clinically  indicated additional testing with an alternate test  methodology [phone]) is advised. The SARS-CoV-2 RNA is generally  detectable in upper and lower respiratory sp ecimens during the acute  phase of infection. The expected result is Negative. Fact Sheet for Patients:  StrictlyIdeas.no Fact Sheet for Healthcare Providers: BankingDealers.co.za This test is not yet approved or cleared by the [place] FDA and has been authorized for detection and/or diagnosis of SARS-CoV-2 by FDA under an Emergency Use Authorization (EUA).  This EUA will remain in effect (meaning this test can be used) for the duration of the COVID-19 declaration under Section 564(b)(1) of the Act, 21 U.S.C. section 360bbb-3(b)(1), unless the authorization is terminated or revoked sooner. Performed at [facility], [address] [place], [place] [address]   Type and screen [place]     Status: None (Preliminary result)   Collection Time: 04/17/19  5:10 PM  Result Value Ref Range   ABO/RH(D) PENDING    Antibody Screen PENDING    Sample Expiration      04/20/2019,2359 Performed at [facility], [address] [place], [place] [address]    No results found.  Pending Labs [employer] (From admission, onward)    Start     Ordered   Signed and Held  CBC  Tomorrow morning,   R     Signed and Held   Signed and Held  Basic metabolic panel  Tomorrow morning,   R     Signed and Held          Vitals/Pain Today's Vitals   04/17/19 1334 04/17/19 1448 04/17/19 1650 04/17/19 1706  BP: (!) 142/109 103/75 (!) 75/55 94/73  Pulse: 60 87 85 (!) 117  Resp: 16 17  18   Temp: 98.2 F (36.8 C)   98.4 F (36.9 C)  TempSrc: Oral   Oral  SpO2: 94% 97%  100%  Weight: 52 kg     Height: 5\' 1"  (1.549 m)     PainSc: 0-No pain       Isolation Precautions Airborne and Contact precautions  Medications Medications  octreotide  (SANDOSTATIN) 500 mcg in sodium chloride 0.9 % 250 mL (2 mcg/mL) infusion (has no administration in time range)  pantoprazole (PROTONIX) 80 mg in sodium chloride 0.9 % 100 mL IVPB (has no administration in time range)  pantoprazole (PROTONIX) 80 mg in sodium chloride 0.9 % 250 mL (0.32 mg/mL) infusion (has no administration in time range)  pantoprazole (PROTONIX) injection 40 mg (has no administration in time range)  cefTRIAXone (ROCEPHIN) 1 g in sodium chloride 0.9 % 100 mL IVPB (1 g Intravenous New Bag/Given 04/17/19 1811)  rifaximin (XIFAXAN) tablet 550 mg (has no administration in time range)  0.9 %  sodium chloride infusion ( Intravenous Bolus 04/17/19 1441)  thiamine (B-1) injection 100 mg (100 mg Intravenous Given 04/17/19 1441)  ondansetron (ZOFRAN) injection 4 mg (4 mg Intravenous Given 04/17/19 1441)  LORazepam (ATIVAN) injection  1 mg (1 mg Intravenous Given 04/17/19 1442)  magnesium sulfate IVPB 2 g 50 mL (0 g Intravenous Stopped 04/17/19 1643)  sodium chloride 0.9 % bolus 500 mL (500 mLs Intravenous New Bag/Given 04/17/19 1708)    Mobility walks with person assist Moderate fall risk   Focused Assessments see ciwaa   R Recommendations: See Admitting Provider Note  Report given to:   Additional Notes:

## 2019-04-17 NOTE — ED Notes (Addendum)
Sent UDS to lab. PT very unsteady and shaky on feet, upon standing almost fell back.

## 2019-04-17 NOTE — Progress Notes (Signed)
Patient gave this RN permission to discuss care with her Mother, Erinn Mendosa; Mother updated on VS and transfer to stepdown/ICU; acknowledged and appreciated call. Barbaraann Faster, RN 11:53 PM 04/17/2019

## 2019-04-17 NOTE — ED Provider Notes (Signed)
[facility] Emergency Department Provider Note       Time seen: ----------------------------------------- 2:12 PM on 04/17/2019 -----------------------------------------   I have reviewed the triage vital signs and the nursing notes.  HISTORY   Chief Complaint Fatigue    HPI Abigail Burton is a 43 y.o. female with a history of alcohol dependence, asthma, cancer, degenerative disc disease, hepatitis C, hypertension, renal disorder who presents to the ED for lethargy, tremor, balance disturbance and confusion.  She was recently diagnosed with cirrhosis of the liver.  Patient states she has not had any alcohol for the last week.  She denies fevers, chills, has had some vomiting.  Past Medical History:  Diagnosis Date  . Alcohol dependence ([place])   . Asthma   . Cancer ([place])    left shoulder melonoma with excision  . DDD (degenerative disc disease), lumbosacral   . Hepatitis C   . Hypertension   . Renal disorder     Patient Active Problem List   Diagnosis Date Noted  . Lesion of lung 04/09/2019  . Alcoholic hepatitis without ascites 03/23/2019  . UTI (urinary tract infection) 03/23/2019  . HTN (hypertension) 03/23/2019  . Hypokalemia 03/23/2019  . Alcoholic hepatitis 13/24/4010  . Chronic hepatitis C without hepatic coma ([place]) 02/24/2019  . Hydrosalpinx 02/24/2019  . Tobacco use disorder 02/26/2018  . Oral contraceptive use 08/03/2017  . Depression 04/12/2017  . Substance induced mood disorder ([place]) 09/28/2016  . 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 History of malignant melanoma of skin 01/30/2013  . Anxiety disorder 12/27/2012  . Alcohol withdrawal ([place]) 11/21/2012  . Alcohol dependence ([place]) 09/08/2010    Past Surgical History:  Procedure Laterality Date  . cancer removal      Allergies Sulfa antibiotics  Social History Social History   Tobacco Use  . Smoking status: Current Every Day Smoker     Packs/day: 0.50    Types: Cigarettes  . Smokeless tobacco: Never Used  Substance Use Topics  . Alcohol use: Not Currently    Comment: "half a fifth per day"  . Drug use: No    Review of Systems Constitutional: Negative for fever. Cardiovascular: Negative for chest pain. Respiratory: Negative for shortness of breath. Gastrointestinal: Negative for abdominal pain, positive for vomiting Musculoskeletal: Negative for back pain. Skin: Negative for rash. Neurological: Negative for headaches, positive for weakness, lethargy  All systems negative/normal/unremarkable except as stated in the HPI  ____________________________________________   PHYSICAL EXAM:  VITAL SIGNS: ED Triage Vitals [04/17/19 1334]  Enc Vitals Group     BP (!) 142/109     Pulse Rate 60     Resp 16     Temp 98.2 F (36.8 C)     Temp Source Oral     SpO2 94 %     Weight 114 lb 10.2 oz (52 kg)     Height 5\' 1"  (1.549 m)     Head Circumference      Peak Flow      Pain Score 0     Pain Loc      Pain Edu?      Excl. in [place]?    Constitutional: Alert and oriented.  Chronically ill-appearing, no distress Eyes: Conjunctivae are normal. Normal extraocular movements. Cardiovascular: Normal rate, regular rhythm. No murmurs, rubs, or gallops. Respiratory: Normal respiratory effort without tachypnea nor retractions. Breath sounds are clear and equal bilaterally. No wheezes/rales/rhonchi. Gastrointestinal: Distended, ascites present, nontender Musculoskeletal: Nontender with normal range of motion  in extremities. No lower extremity tenderness nor edema. Neurologic:  Normal speech and language. No gross focal neurologic deficits are appreciated.  Skin:  Skin is warm, dry and intact. No rash noted. Psychiatric: Mood and affect are normal. Speech and behavior are normal.  ____________________________________________  EKG: Interpreted by me.  Sinus rhythm with rate of 97 bpm, low voltage, normal axis, normal  QT  ____________________________________________  ED COURSE:  As part of my medical decision making, I reviewed the following data within the [place] History obtained from family if available, nursing notes, old chart and ekg, as well as notes from prior ED visits. Patient presented for weakness, lethargy, we will assess with labs as indicated at this time.   Procedures  Abigail Burton was evaluated in Emergency Department on 04/17/2019 for the symptoms described in the history of present illness. She was evaluated in the context of the global COVID-19 pandemic, which necessitated consideration that the patient might be at risk for infection with the SARS-CoV-2 virus that causes COVID-19. Institutional protocols and algorithms that pertain to the evaluation of patients at risk for COVID-19 are in a state of rapid change based on information released by regulatory bodies including the CDC and federal and state organizations. These policies and algorithms were followed during the patient's care in the ED.  ____________________________________________   LABS (pertinent positives/negatives)  Labs Reviewed  BASIC METABOLIC PANEL - Abnormal; Notable for the following components:      Result Value   Sodium 133 (*)    Calcium 8.6 (*)    All other components within normal limits  CBC - Abnormal; Notable for the following components:   RBC 3.29 (*)    HCT 34.9 (*)    MCV 106.1 (*)    MCH 36.8 (*)    All other components within normal limits  URINALYSIS, COMPLETE (UACMP) WITH MICROSCOPIC  HEPATIC FUNCTION PANEL  AMMONIA  MAGNESIUM  PROTIME-INR  POC URINE PREG, ED  ____________________________________________   DIFFERENTIAL DIAGNOSIS   Alcohol withdrawal, dehydration, electrolyte abnormality, Warnicke's encephalopathy, occult infection  FINAL ASSESSMENT AND PLAN  Lethargy, cirrhosis   Plan: The patient had presented for general ill feeling with lethargy and  confusion. Patient's labs initially have been unremarkable.  We are pending an ammonia level as well as some other electrolytes and liver function test.  We have given her IV fluids as well as Ativan, Zofran and thiamine.  Final disposition is pending at this time.   Laurence Aly, MD    Note: This note was generated in part or whole with voice recognition software. Voice recognition is usually quite accurate but there are transcription errors that can and very often do occur. I apologize for any typographical errors that were not detected and corrected.     Earleen Newport, MD 04/17/19 [id number]

## 2019-04-17 NOTE — H&P (Signed)
[facility] NAME: Abigail Burton    MR#:  [id number]  DATE OF BIRTH:  11-23-1975  DATE OF ADMISSION:  04/17/2019  PRIMARY CARE PHYSICIAN: Lavera Guise, PA-C   REQUESTING/REFERRING PHYSICIAN: Merlyn Lot, MD  CHIEF COMPLAINT:   Chief Complaint  Patient presents with  . Fatigue    HISTORY OF PRESENT ILLNESS: Abigail Burton  is a 43 y.o. female with a known history of liver cirrhosis, hepatitis C, hypertension, abnormal CT scan of the chest who has been hospitalized initially on July 24 to July 28 and then recently discharged October 10-13.  Patient used to drink heavily prior to that.  According to mother she has not drink since July and lives with her.  They initially brought the patient due to being very tremulous and unsteady on her feet.  However patient in the emergency room is noted to have blood pressures in the 70s.  She has received IV fluids.  Also patient had called her GI physician as outpatient told him that she was having bleeding.  She currently states that she had some episode of bleeding but not able to provide more details.  Her mother is at bedside as well.  They both seem to be very poor historians.  They are very focused on patient seeing oncology tomorrow for a PET scan for the lung lesion.  Patient denies any abdominal pain.  She states that she has not drank since prior to July 24.      PAST MEDICAL HISTORY:   Past Medical History:  Diagnosis Date  . Alcohol dependence ([place])   . Asthma   . Cancer ([place])    left shoulder melonoma with excision  . DDD (degenerative disc disease), lumbosacral   . Hepatitis C   . Hypertension   . Renal disorder     PAST SURGICAL HISTORY:  Past Surgical History:  Procedure Laterality Date  . cancer removal      SOCIAL HISTORY:  Social History   Tobacco Use  . Smoking status: Current Every Day Smoker    Packs/day: 0.50    Types: Cigarettes  . Smokeless  tobacco: Never Used  Substance Use Topics  . Alcohol use: Not Currently    Comment: "half a fifth per day"    FAMILY HISTORY:  Family History  Problem Relation Age of Onset  . Hypertension Other     DRUG ALLERGIES:  Allergies  Allergen Reactions  . Sulfa Antibiotics Other (See Comments)    Reaction: unknown    REVIEW OF SYSTEMS:   CONSTITUTIONAL: No fever, fatigue or weakness.  EYES: No blurred or double vision.  EARS, NOSE, AND THROAT: No tinnitus or ear pain.  RESPIRATORY: No cough, shortness of breath, wheezing or hemoptysis.  CARDIOVASCULAR: No chest pain, orthopnea, edema.  GASTROINTESTINAL: No nausea, vomiting, diarrhea or abdominal pain.  GENITOURINARY: No dysuria, hematuria.  ENDOCRINE: No polyuria, nocturia,  HEMATOLOGY: No anemia, easy bruising or bleeding SKIN: No rash or lesion. MUSCULOSKELETAL: No joint pain or arthritis.   NEUROLOGIC: No tingling, numbness, weakness.  Tremulous unsteady gait PSYCHIATRY: No anxiety or depression.   MEDICATIONS AT HOME:  Prior to Admission medications   Medication Sig Start Date End Date Taking? Authorizing Provider  buPROPion (WELLBUTRIN XL) 300 MG 24 hr tablet Take 300 mg by mouth daily. 05/31/18   [provider]  furosemide (LASIX) 20 MG tablet Take 1 tablet (20 mg total) by mouth daily. 03/26/19 05/25/19  Abel Presto  J, MD  gabapentin (NEURONTIN) 300 MG capsule Take 900 mg by mouth 3 (three) times daily.  02/23/17   [provider]  lactulose (CHRONULAC) 10 GM/15ML solution Take 45 mLs (30 g total) by mouth daily. 03/26/19 04/25/19  Henreitta Leber, MD  sertraline (ZOLOFT) 50 MG tablet Take 50 mg by mouth 2 (two) times a day.  05/31/18   [provider]  spironolactone (ALDACTONE) 50 MG tablet Take 1 tablet (50 mg total) by mouth daily. 03/26/19 05/25/19  Henreitta Leber, MD  trazodone (DESYREL) 300 MG tablet Take 300 mg by mouth at bedtime as needed for sleep.    [provider]       PHYSICAL EXAMINATION:   VITAL SIGNS: Blood pressure 94/73, pulse (!) 117, temperature 98.4 F (36.9 C), temperature source Oral, resp. rate 18, height 5\' 1"  (1.549 m), weight 52 kg, SpO2 100 %.  GENERAL:  43 y.o.-year-old patient lying in the bed with no acute distress.  EYES: Pupils equal, round, reactive to light and accommodation. No scleral icterus. Extraocular muscles intact.  HEENT: Head atraumatic, normocephalic. Oropharynx and nasopharynx clear.  NECK:  Supple, no jugular venous distention. No thyroid enlargement, no tenderness.  LUNGS: Normal breath sounds bilaterally, no wheezing, rales,rhonchi or crepitation. No use of accessory muscles of respiration.  CARDIOVASCULAR: S1, S2 normal. No murmurs, rubs, or gallops.  ABDOMEN: Soft, nontender, nondistended. Bowel sounds present. No organomegaly or mass.  EXTREMITIES: No pedal edema, cyanosis, or clubbing.  NEUROLOGIC: Cranial nerves II through XII are intact. Muscle strength 5/5 in all extremities. Sensation intact. Gait not checked.  PSYCHIATRIC: The patient is alert and oriented x 3.  SKIN: No obvious rash, lesion, or ulcer.   LABORATORY PANEL:   CBC Recent Labs  Lab 04/17/19 1337  WBC 8.8  HGB 12.1  HCT 34.9*  PLT 268  MCV 106.1*  MCH 36.8*  MCHC 34.7  RDW 13.6   ------------------------------------------------------------------------------------------------------------------  Chemistries  Recent Labs  Lab 04/17/19 1337 04/17/19 1428  NA 133*  --   K 3.8  --   CL 105  --   CO2 23  --   GLUCOSE 88  --   BUN 7  --   CREATININE 0.85  --   CALCIUM 8.6*  --   MG  --  1.6*  AST 97*  --   ALT 36  --   ALKPHOS 151*  --   BILITOT 1.4*  --    ------------------------------------------------------------------------------------------------------------------ estimated creatinine clearance is 64.4 mL/min (by C-G formula based on SCr of 0.85  mg/dL). ------------------------------------------------------------------------------------------------------------------ No results for input(s): TSH, T4TOTAL, T3FREE, THYROIDAB in the last 72 hours.  Invalid input(s): FREET3   Coagulation profile Recent Labs  Lab 04/17/19 1428  INR 1.3*   ------------------------------------------------------------------------------------------------------------------- No results for input(s): DDIMER in the last 72 hours. -------------------------------------------------------------------------------------------------------------------  Cardiac Enzymes No results for input(s): CKMB, TROPONINI, MYOGLOBIN in the last 168 hours.  Invalid input(s): CK ------------------------------------------------------------------------------------------------------------------ Invalid input(s): POCBNP  ---------------------------------------------------------------------------------------------------------------  Urinalysis    Component Value Date/Time   COLORURINE AMBER (A) 04/17/2019 1428   APPEARANCEUR CLOUDY (A) 04/17/2019 1428   LABSPEC 1.021 04/17/2019 1428   PHURINE 5.0 04/17/2019 1428   GLUCOSEU NEGATIVE 04/17/2019 1428   HGBUR SMALL (A) 04/17/2019 1428   BILIRUBINUR SMALL (A) 04/17/2019 1428   KETONESUR NEGATIVE 04/17/2019 1428   PROTEINUR 30 (A) 04/17/2019 1428   NITRITE NEGATIVE 04/17/2019 1428   LEUKOCYTESUR MODERATE (A) 04/17/2019 1428     RADIOLOGY: No results found.  EKG:  Orders placed or performed during the hospital encounter of 04/17/19  . ED EKG  . ED EKG    IMPRESSION AND PLAN: Patient is 43 year old being admitted with hypotension possible GI bleed  1.  GI bleed we will follow hemoglobin continue octreotide and Protonix and ceftriaxone as per GI direction They will be performing endoscopy tomorrow morning  2.  Hypotension likely due to diuresis progressive liver disease we will continue IV fluids for now placed on  midodrine   3.  Tremulous shakiness I will check a B12 level on this patient.,  Check vitamin D level as well We will give her IV thiamine  4.  Liver cirrhosis prognosis very poor  5.  Abnormal CT scan of the chest with cavitary lesion needs outpatient PET scan and will have to re-be rescheduled.  6.  Chronic hepatic encephalopathy continue lactulose and Xifaxan has been started     All the records are reviewed and case discussed with ED provider. Management plans discussed with the patient, family and they are in agreement.  CODE STATUS: Code Status History    Date Active Date Inactive Code Status Order ID Comments User Context   04/09/2019 0411 04/10/2019 1810 Full Code [id number]  Harrie Foreman, MD Inpatient   03/23/2019 0236 03/26/2019 1856 Full Code [id number]  Lance Coon, MD Inpatient   Advance Care Planning Activity       TOTAL TIME TAKING CARE OF THIS PATIENT: 50 minutes.    Dustin Flock M.D on 04/17/2019 at 5:42 PM  Between 7am to 6pm - Pager - [phone]  After 6pm go to [email or web address] - Proofreader  [employer] [facility]  [phone]  CC: Primary care physician; Lavera Guise, PA-C

## 2019-04-17 NOTE — ED Triage Notes (Signed)
Reports that she was recently discharged from hospital but has been feeling lethargic and confused X 6 days. Dx with cirrhosis of liver recently.

## 2019-04-17 NOTE — Consult Note (Signed)
Name: Abigail Burton MRN: [id number] DOB: August 21, 1976    ADMISSION DATE:  04/17/2019 CONSULTATION DATE:  04/17/2019  REFERRING MD : Dr. Jannifer Franklin  CHIEF COMPLAINT: Hypotension, acute GI bleed  BRIEF PATIENT DESCRIPTION:  43 year old female with past medical history notable for alcohol abuse, liver cirrhosis, hepatitis C, hypertension, abnormal CT scan of the chest with cavitary lesion admitted to [place] unit on 8/19 with hypotension secondary to questionable acute GI bleed and diuresis which was responsive to IV fluids.  Later on 8/19 she was again noted to be hypotensive requiring transfer to stepdown for possible vasopressors.  She is on Protonix and octreotide infusions.  GI is following with plans for EGD on 8/20.  SIGNIFICANT EVENTS  8/19>> admission to MedSurg unit 8/19>> transfer to stepdown unit for hypotension  STUDIES:  N/A  CULTURES: SARS-CoV-2 PCR 8/19>> negative MRSA PCR 8/20>> negative Urine 8/20>> Blood x2 8/20>>  ANTIBIOTICS: Rocephin 8/19>>  HISTORY OF PRESENT ILLNESS:   Abigail Burton is a 43 year old female with a past medical history notable for alcohol abuse, liver cirrhosis, hepatitis C, hypertension, abnormal CT scan of the chest who presented to [facility] ED on 04/17/2019 with complaints of fatigue, lethargy, tremors, balance disturbance and confusion. Pt is a poor historian, therefore history is obtained from ED and nursing notes. She reported that she had some GI bleeding at home, however unable to offer any details regarding her bleeding. Upon presentation to the ED she was noted to be hypotensive with systolic blood pressure in the 70s.  She received IV fluids with improvement in her blood pressure.  Initial work-up in the ED revealed hemoglobin 12.1, INR 1.3, PT 15.9, alkaline phosphatase 151, AST 97, total bilirubin 1.4, ammonia 37.  Urinalysis is concerning for UTI, urine drug screen is positive for benzodiazepines, and her SARS-CoV-2 PCR is negative.  Of note she was  noted to have an abnormal CT chest scan with lung lesion, and she is supposed to see oncology tomorrow for a PET scan.  She reports that she has not drank since July 24.  She was admitted to [place] unit for further work-up and treatment of hypotension secondary to diuresis and questionable GI bleed.  However late in the evening on 8/19 she again was noted to be hypotensive.  GI recommended transfer to stepdown unit for closer monitoring.  PCCM is consulted for further management.  PAST MEDICAL HISTORY :   has a past medical history of Alcohol dependence ([place]), Asthma, Cancer ([place]), DDD (degenerative disc disease), lumbosacral, Hepatitis C, Hypertension, and Renal disorder.  has a past surgical history that includes cancer removal. Prior to Admission medications   Medication Sig Start Date End Date Taking? Authorizing Provider  buPROPion (WELLBUTRIN XL) 300 MG 24 hr tablet Take 300 mg by mouth daily. 05/31/18  Yes [provider]  furosemide (LASIX) 20 MG tablet Take 1 tablet (20 mg total) by mouth daily. 03/26/19 05/25/19 Yes Sainani, Belia Heman, MD  gabapentin (NEURONTIN) 300 MG capsule Take 900 mg by mouth 3 (three) times daily.  02/23/17  Yes [provider]  lactulose (CHRONULAC) 10 GM/15ML solution Take 45 mLs (30 g total) by mouth daily. 03/26/19 04/25/19 Yes Sainani, Belia Heman, MD  sertraline (ZOLOFT) 50 MG tablet Take 50 mg by mouth 2 (two) times a day.  05/31/18  Yes [provider]  spironolactone (ALDACTONE) 50 MG tablet Take 1 tablet (50 mg total) by mouth daily. 03/26/19 05/25/19 Yes Henreitta Leber, MD  trazodone (DESYREL) 300 MG tablet Take  300 mg by mouth at bedtime as needed for sleep.   Yes [provider]   Allergies  Allergen Reactions   Sulfa Antibiotics Other (See Comments)    Reaction: unknown    FAMILY HISTORY:  family history includes Hypertension in an other family member. SOCIAL HISTORY:  reports that she has been smoking cigarettes.  She has been smoking about 0.50 packs per day. She has never used smokeless tobacco. She reports previous alcohol use. She reports that she does not use drugs.   COVID-19 DISASTER DECLARATION:  FULL CONTACT PHYSICAL EXAMINATION WAS NOT POSSIBLE DUE TO TREATMENT OF COVID-19 AND  CONSERVATION OF PERSONAL PROTECTIVE EQUIPMENT, LIMITED EXAM FINDINGS INCLUDE-  Patient assessed or the symptoms described in the history of present illness.  In the context of the Global COVID-19 pandemic, which necessitated consideration that the patient might be at risk for infection with the SARS-CoV-2 virus that causes COVID-19, Institutional protocols and algorithms that pertain to the evaluation of patients at risk for COVID-19 are in a state of rapid change based on information released by regulatory bodies including the CDC and federal and state organizations. These policies and algorithms were followed during the patient's care while in hospital.  REVIEW OF SYSTEMS: Positives in bold Constitutional: Negative for fever, chills, weight loss, +malaise/fatigue and diaphoresis.  HENT: Negative for hearing loss, ear pain, nosebleeds, congestion, sore throat, neck pain, tinnitus and ear discharge.   Eyes: Negative for blurred vision, double vision, photophobia, pain, discharge and redness.  Respiratory: Negative for cough, hemoptysis, sputum production, shortness of breath, wheezing and stridor.   Cardiovascular: Negative for chest pain, palpitations, orthopnea, claudication, leg swelling and PND.  Gastrointestinal: Negative for heartburn, nausea, vomiting, +abdominal pain, diarrhea, constipation, blood in stool and melena.  Genitourinary: Negative for dysuria, urgency, frequency, hematuria and flank pain.  Musculoskeletal: Negative for myalgias, back pain, joint pain and falls.  Skin: Negative for itching and rash.  Neurological: Negative for +dizziness, tingling, tremors, sensory change, speech change, focal  weakness, seizures, loss of consciousness, weakness and headaches.  Endo/Heme/Allergies: Negative for environmental allergies and polydipsia. Does not bruise/bleed easily.  SUBJECTIVE:  Pt reports generalized fatigue/malaise, abdominal discomfort and dizziness Denies chest pain, SOB, cough, fever/chills On room air Wants pain medications  VITAL SIGNS: Temp:  [97.6 F (36.4 C)-98.4 F (36.9 C)] 97.6 F (36.4 C) (08/19 2202) Pulse Rate:  [60-117] 65 (08/19 2202) Resp:  [16-20] 20 (08/19 2202) BP: (75-142)/(55-109) 86/70 (08/19 2202) SpO2:  [94 %-100 %] 99 % (08/19 2202) Weight:  [52 kg-53.6 kg] 53.6 kg (08/19 2202)  PHYSICAL EXAMINATION: General: Acute on chronically ill-appearing female, laying in bed, on room air, no acute distress, does report abdominal discomfort Neuro: Awake, alert and oriented x3, follows commands, no focal deficits, speech clear HEENT: Atraumatic, normocephalic, neck supple, no JVD, pupils PERRLA Cardiovascular: Regular rate and rhythm, S1-S2, no murmurs rubs or gallops, 2+ pulses Lungs: Clear to auscultation bilaterally, even, nonlabored, normal effort Abdomen: Soft, distended, tender, no guarding or rebound tenderness, bowel sounds positive x4 Musculoskeletal: Generalized weakness, normal bulk and tone, no deformities, no edema Skin: Warm and dry, no obvious rashes, lesions, or ulcerations  Recent Labs  Lab 04/17/19 1337  NA 133*  K 3.8  CL 105  CO2 23  BUN 7  CREATININE 0.85  GLUCOSE 88   Recent Labs  Lab 04/17/19 1337 04/17/19 2342  HGB 12.1 11.6*  HCT 34.9* 34.6*  WBC 8.8 7.2  PLT 268 220   No results found.  ASSESSMENT / PLAN:  Hypotension, likely Hypovolemic shock in setting of diuresis +/- GI Bleed -Continuous cardiac monitoring -Maintain MAP greater than 65 -IV fluids -Currently is receiving 500 mL normal saline bolus (total 2L boluses today) -Hold home Lasix -Continue Midodrine -Neosynephrine if needed to maintain MAP  goal -Check Cortisol -Procalcitonin is negative  Acute GI bleed Liver cirrhosis Hx: Hepatitis C -N.p.o. -Continue Protonix and octreotide infusions -Continue Rocephin for SBP prophylaxis -GI following, appreciate input -Plan for EGD on 8/20 -Transfuse PRBCs as indicated  Acute blood loss anemia -Monitor for S/Sx of bleeding -Trend CBC -SCDs for VTE Prophylaxis (no chemical prophylaxis given GI bleed) -Transfuse for Hgb <8  UTI -Monitor fever curve -Trend WBCs -Procalcitonin is negative -Follow cultures as above -Continue Rocephin  Tremulous Chronic hepatic encephalopathy -Provide supportive care -Continue lactulose and rifaximin -Serum ammonia 37 -Serum B12 and vitamin D pending -Continue IV thiamine  Abnormal CT chest with cavitary lesion noted Hx: Asthma -Supplemental O2 as needed to maintain O2 sats greater than 92% -Follow intermittent chest x-ray and ABG as needed -PRN bronchodilators -Will need outpatient PET scan and Oncology follow up        Disposition: Stepdown Goals of care: Full code VTE prophylaxis: SCDs Updates: Updated patient at bedside 04/18/2019  Darel Hong, [facility] [place] Pager: [phone] Cell: [phone]  04/17/2019, 11:59 PM

## 2019-04-17 NOTE — Progress Notes (Signed)
Dr. Jannifer Franklin notified by secure chat of hypotension of 82/58; awaiting acknowledgement; Barbaraann Faster, RN 11:03 PM 04/17/2019

## 2019-04-17 NOTE — ED Notes (Signed)
This RN attempted to gain access x3 without success

## 2019-04-17 NOTE — Progress Notes (Signed)
Conferred with Dr. Jannifer Franklin; Dr. Marius Ditch requesting transfer to ICU for pressure monitoring; Bolus running at this time; laboratory at bedside; informed patient that she will be transferred to ICU for close monitoring, acknowledged. Barbaraann Faster, RN 11:43 PM 04/17/2019

## 2019-04-17 NOTE — Progress Notes (Signed)
Dr. Jannifer Franklin paged for hypotension; Dr. Marius Ditch also notified, aware; No BM this pm, no acute/obvious bleeding noted; IVF @ 100 ml/hr. Awaiting callback from on-call Hospitalist. Barbaraann Faster, RN 11:29 PM 04/17/2019

## 2019-04-18 ENCOUNTER — Telehealth: Payer: Self-pay | Admitting: *Deleted

## 2019-04-18 ENCOUNTER — Inpatient Hospital Stay: Payer: Self-pay

## 2019-04-18 ENCOUNTER — Inpatient Hospital Stay: Payer: Self-pay | Admitting: Oncology

## 2019-04-18 DIAGNOSIS — I959 Hypotension, unspecified: Secondary | ICD-10-CM

## 2019-04-18 DIAGNOSIS — K7031 Alcoholic cirrhosis of liver with ascites: Secondary | ICD-10-CM

## 2019-04-18 LAB — COMPREHENSIVE METABOLIC PANEL
ALT: 31 U/L (ref 0–44)
AST: 80 U/L — ABNORMAL HIGH (ref 15–41)
Albumin: 2 g/dL — ABNORMAL LOW (ref 3.5–5.0)
Alkaline Phosphatase: 121 U/L (ref 38–126)
Anion gap: 4 — ABNORMAL LOW (ref 5–15)
BUN: 5 mg/dL — ABNORMAL LOW (ref 6–20)
CO2: 21 mmol/L — ABNORMAL LOW (ref 22–32)
Calcium: 7.7 mg/dL — ABNORMAL LOW (ref 8.9–10.3)
Chloride: 109 mmol/L (ref 98–111)
Creatinine, Ser: 0.81 mg/dL (ref 0.44–1.00)
GFR calc Af Amer: >60 mL/min (ref >60)
GFR calc non Af Amer: >60 mL/min (ref >60)
Glucose, Bld: 68 mg/dL — ABNORMAL LOW (ref 70–99)
Potassium: 4.1 mmol/L (ref 3.5–5.1)
Sodium: 134 mmol/L — ABNORMAL LOW (ref 135–145)
Total Bilirubin: 1.2 mg/dL (ref 0.3–1.2)
Total Protein: 5.3 g/dL — ABNORMAL LOW (ref 6.5–8.1)

## 2019-04-18 LAB — BASIC METABOLIC PANEL
Anion gap: 3 — ABNORMAL LOW (ref 5–15)
BUN: <5 mg/dL — ABNORMAL LOW (ref 6–20)
CO2: 21 mmol/L — ABNORMAL LOW (ref 22–32)
Calcium: 7.7 mg/dL — ABNORMAL LOW (ref 8.9–10.3)
Chloride: 112 mmol/L — ABNORMAL HIGH (ref 98–111)
Creatinine, Ser: 0.87 mg/dL (ref 0.44–1.00)
GFR calc Af Amer: >60 mL/min (ref >60)
GFR calc non Af Amer: >60 mL/min (ref >60)
Glucose, Bld: 152 mg/dL — ABNORMAL HIGH (ref 70–99)
Potassium: 4.2 mmol/L (ref 3.5–5.1)
Sodium: 136 mmol/L (ref 135–145)

## 2019-04-18 LAB — PROCALCITONIN: Procalcitonin: <0.1 ng/mL

## 2019-04-18 LAB — CBC
HCT: 30.8 % — ABNORMAL LOW (ref 36.0–46.0)
Hemoglobin: 10.5 g/dL — ABNORMAL LOW (ref 12.0–15.0)
MCH: 36.5 pg — ABNORMAL HIGH (ref 26.0–34.0)
MCHC: 34.1 g/dL (ref 30.0–36.0)
MCV: 106.9 fL — ABNORMAL HIGH (ref 80.0–100.0)
Platelets: 217 10*3/uL (ref 150–400)
RBC: 2.88 MIL/uL — ABNORMAL LOW (ref 3.87–5.11)
RDW: 14 % (ref 11.5–15.5)
WBC: 7.4 10*3/uL (ref 4.0–10.5)
nRBC: 0 % (ref 0.0–0.2)

## 2019-04-18 LAB — BODY FLUID CELL COUNT WITH DIFFERENTIAL
Eos, Fluid: 0 %
Lymphs, Fluid: 23 %
Monocyte-Macrophage-Serous Fluid: 64 %
Neutrophil Count, Fluid: 13 %
Total Nucleated Cell Count, Fluid: 150 cu mm

## 2019-04-18 LAB — GLUCOSE, CAPILLARY
Glucose-Capillary: 178 mg/dL — ABNORMAL HIGH (ref 70–99)
Glucose-Capillary: 57 mg/dL — ABNORMAL LOW (ref 70–99)

## 2019-04-18 LAB — CORTISOL: Cortisol, Plasma: 3.5 ug/dL

## 2019-04-18 LAB — VITAMIN B12: Vitamin B-12: 1056 pg/mL — ABNORMAL HIGH (ref 180–914)

## 2019-04-18 LAB — MRSA PCR SCREENING: MRSA by PCR: NEGATIVE

## 2019-04-18 LAB — LACTIC ACID, PLASMA: Lactic Acid, Venous: 1.9 mmol/L (ref 0.5–1.9)

## 2019-04-18 MED ORDER — SODIUM CHLORIDE 0.9 % IV SOLN
0.0000 ug/min | INTRAVENOUS | Status: DC
  Filled 2019-04-18: qty 1

## 2019-04-18 MED ORDER — DEXTROSE 50 % IV SOLN
50.0000 mL | Freq: Once | INTRAVENOUS | Status: AC
  Administered 2019-04-18: 50 mL via INTRAVENOUS
  Filled 2019-04-18: qty 50

## 2019-04-18 MED ORDER — ALBUMIN HUMAN 25 % IV SOLN
50.0000 g | Freq: Once | INTRAVENOUS | Status: AC
  Administered 2019-04-18: 50 g via INTRAVENOUS
  Filled 2019-04-18: qty 50

## 2019-04-18 MED ORDER — ALBUMIN NICU 25% IV SOLUTION: 50.0000 g | Freq: Once | INTRAVENOUS | Status: DC

## 2019-04-18 MED ORDER — PANTOPRAZOLE SODIUM 40 MG IV SOLR
40.0000 mg | Freq: Two times a day (BID) | INTRAVENOUS | Status: DC
  Administered 2019-04-18 – 2019-04-19 (×3): 40 mg via INTRAVENOUS
  Filled 2019-04-18 (×3): qty 40

## 2019-04-18 MED ORDER — LACTATED RINGERS IV SOLN
INTRAVENOUS | Status: DC
  Administered 2019-04-18 – 2019-04-19 (×2): via INTRAVENOUS

## 2019-04-18 MED ORDER — CHLORHEXIDINE GLUCONATE CLOTH 2 % EX PADS
6.0000 | MEDICATED_PAD | Freq: Every day | CUTANEOUS | Status: DC
  Administered 2019-04-18 – 2019-04-19 (×2): 6 via TOPICAL

## 2019-04-18 NOTE — Progress Notes (Signed)
Patient transported via bed to ultrasound for paracentesis.  Patient transported by Vernie Shanks, orderly.

## 2019-04-18 NOTE — Consult Note (Signed)
Jonathon Bellows , MD [address] [place], [place], [place], [address] [address] [place], [place], [place], [address] Phone: [phone]  Fax: [phone]  Consultation  Referring Provider:  DR Stark Jock Primary Care Physician:  Roderick Pee Primary Gastroenterologist:  [facility]         Reason for Consultation:     GI bleed  Date of Admission:  04/17/2019 Date of Consultation:  04/18/2019         HPI:   Abigail Burton is a 43 y.o. female follows with [facility] [place] gi for alcoholic liver cirrhosis , ascites and portal vein thrombosis. H/o hepatitis C.Recent hospitalization in July 24-28 . No alcohol since July. Brought into the hospital for being unsteady on her feet and lowblood pressure .Normal BUN/Cr ratio on admission hemoglobin electrophoresis at baseline  LFT's have improved since last 2 weeks The cholesterol is elevated. She should follow a low fat, low cholesterol diet. Repeat test in 6-12 months. Magnesium.  Urine positive for Benzodiazepines.    H/o excess alcohol consumption since age 26 , last drink was > 2 weeks back, came in for abdominal distension , denies any hematemesis, melena, Says her stool is brown. C/o increased abdominal girth recently. Takes alleve daily.   Past Medical History:  Diagnosis Date  . Alcohol dependence ([place])   . Asthma   . Cancer ([place])    left shoulder melonoma with excision  . DDD (degenerative disc disease), lumbosacral   . Hepatitis C   . Hypertension   . Renal disorder     Past Surgical History:  Procedure Laterality Date  . cancer removal      Prior to Admission medications   Medication Sig Start Date End Date Taking? Authorizing Provider  buPROPion (WELLBUTRIN XL) 300 MG 24 hr tablet Take 300 mg by mouth daily. 05/31/18  Yes [provider]  furosemide (LASIX) 20 MG tablet Take 1 tablet (20 mg total) by mouth daily. 03/26/19 05/25/19 Yes Sainani, Belia Heman, MD  gabapentin (NEURONTIN) 300 MG capsule Take 900 mg by  mouth 3 (three) times daily.  02/23/17  Yes [provider]  lactulose (CHRONULAC) 10 GM/15ML solution Take 45 mLs (30 g total) by mouth daily. 03/26/19 04/25/19 Yes Sainani, Belia Heman, MD  sertraline (ZOLOFT) 50 MG tablet Take 50 mg by mouth 2 (two) times a day.  05/31/18  Yes [provider]  spironolactone (ALDACTONE) 50 MG tablet Take 1 tablet (50 mg total) by mouth daily. 03/26/19 05/25/19 Yes Henreitta Leber, MD  trazodone (DESYREL) 300 MG tablet Take 300 mg by mouth at bedtime as needed for sleep.   Yes [provider]    Family History  Problem Relation Age of Onset  . Hypertension Other      Social History   Tobacco Use  . Smoking status: Current Every Day Smoker    Packs/day: 0.50    Types: Cigarettes  . Smokeless tobacco: Never Used  Substance Use Topics  . Alcohol use: Not Currently    Comment: "half a fifth per day"  . Drug use: No    Allergies as of 04/17/2019 - Review Complete 04/17/2019  Allergen Reaction Noted  . Sulfa antibiotics Other (See Comments) 03/05/2017    Review of Systems:    All systems reviewed and negative except where noted in HPI.   Physical Exam:  Vital signs in last 24 hours: Temp:  [97.6 F (36.4 C)-98.4 F (36.9 C)] 98 F (36.7 C) (08/20 0400) Pulse  Rate:  [60-117] 91 (08/20 0630) Resp:  [14-20] 17 (08/20 0630) BP: (75-142)/(52-109) 81/58 (08/20 0630) SpO2:  [90 %-100 %] 93 % (08/20 0630) Weight:  [52 kg-55.2 kg] 55.2 kg (08/20 0030) Last BM Date: 04/17/19 General:   Pleasant, cooperative in NAD Head:  Normocephalic and atraumatic. Eyes:   No icterus.   Conjunctiva pink. PERRLA. Ears:  Normal auditory acuity. Neck:  Supple; no masses or thyroidomegaly Lungs: Respirations even and unlabored. Lungs clear to auscultation bilaterally.   No wheezes, crackles, or rhonchi.  Heart:  Regular rate and rhythm;  Without murmur, clicks, rubs or gallops Abdomen:  Soft, distended ontender. Normal bowel sounds. No  appreciable masses or hepatomegaly.  No rebound or guarding. Free fluid + Neurologic:  Alert and oriented x3;  grossly normal neurologically. Skin:  Intact without significant lesions or rashes. Cervical Nodes:  No significant cervical adenopathy. Psych:  Alert and cooperative. Normal affect.  LAB RESULTS: Recent Labs    04/17/19 1337 04/17/19 2342 04/18/19 0517  WBC 8.8 7.2 7.4  HGB 12.1 11.6* 10.5*  HCT 34.9* 34.6* 30.8*  PLT 268 220 217   BMET Recent Labs    04/17/19 1337 04/17/19 2342 04/18/19 0517  NA 133* 134* 136  K 3.8 4.1 4.2  CL 105 109 112*  CO2 23 21* 21*  GLUCOSE 88 68* 152*  BUN 7 5* <5*  CREATININE 0.85 0.81 0.87  CALCIUM 8.6* 7.7* 7.7*   LFT Recent Labs    04/17/19 1337 04/17/19 2342  PROT 6.1* 5.3*  ALBUMIN 2.4* 2.0*  AST 97* 80*  ALT 36 31  ALKPHOS 151* 121  BILITOT 1.4* 1.2  BILIDIR 0.6*  --   IBILI 0.8  --    PT/INR Recent Labs    04/17/19 1428  LABPROT 15.9*  INR 1.3*    STUDIES: No results found.    Impression / Plan:   Abigail Burton is a 43 y.o. y/o female with a history of decompensated liver cirrhosis likely secondary to alcohol and chronic hepatitis C, complicated by ascites , hepatic encephelopathy and  Concern previously for PVT but none seen on CT abdomen.  Admitted with hypotension . Denies any hematemesis or melena  Hb stable. LFT's are rather improved since last admission since she stopped all alcohol.Never had an EGD. Follows at [facility] GI. Normal BUN/Cr ratio   Plan  1. Diagnostic and therapeutic paracentesis- R/o SBP 2. Replace low el;ectrolytes- likely diuresis has caused hypotension along with lactulose. She has also been taking Alleve which has worsened her ascites 3. Lactulose titrated to two soft bowel movements a day and to be decreased in dose if has diarrhea  4. IF hb stable tomorrow EGD as an outpatient if drops then as inpatient . 5. Stop All NSAID use.   Thank you for involving me in the care of this  patient.      LOS: 1 day   Jonathon Bellows, MD  04/18/2019, 9:20 AM

## 2019-04-18 NOTE — Progress Notes (Signed)
Telephone report called to [facility] on 1C.  Patient transported via wheelchair to room 131.

## 2019-04-18 NOTE — Progress Notes (Signed)
Transferred to ICU#2; Jearld Lesch, RN received/accepted report and patient. No distress at this time.Barbaraann Faster, RN 12:28 AM 04/18/2019

## 2019-04-18 NOTE — Progress Notes (Signed)
Initial Nutrition Assessment  RD working remotely.  DOCUMENTATION CODES:   Not applicable  INTERVENTION:  Once diet advanced provide: -Ensure Enlive po BID, each supplement provides 350 kcal and 20 grams of protein -Daily MVI  NUTRITION DIAGNOSIS:   Increased nutrient needs related to catabolic illness(hepatitis C, liver cirrhosis) as evidenced by estimated needs.  GOAL:   Patient will meet greater than or equal to 90% of their needs  MONITOR:   Diet advancement, PO intake, Supplement acceptance, Labs, Weight trends, I & O's  REASON FOR ASSESSMENT:   Malnutrition Screening Tool    ASSESSMENT:   43 year old female with PMHx of HTN, asthma, hepatitis C, liver cirrhosis, EtOH abuse, lumbosacral DDD, abnormal CT scan of the chest with cavitary lesion admitted with hypotension and questionable GI bleed.   Attempted to call patient over the phone for nutrition/weight history but she was unable to answer. Patient known to this RD from an assessment last month. At that time she had reported a decreased appetite for 2 months and was only eating one meal per day. She did report that her one meal was a "good-sized" meal that was usually steak with potatoes or spaghetti. Unsure how she has been eating since. She is currently NPO.  Patient was 58 kg on 03/23/2019. She is now 55.2 kg (121.69 lbs). She has lost 2.8 kg (4.8% body weight) over the past month, which is not significant for time frame.  Medications reviewed and include: gabapentin, lactulose, pantoprazole, sertraline, thiamine 100 mg daily IV, LR at 50 mL/hr.  Labs reviewed: CBG 178, Chloride 112, CO2 21, BUN <5.  Patient is at risk for malnutrition but unable to determine if she meets criteria without completing NFPE.  NUTRITION - FOCUSED PHYSICAL EXAM:  Unable to complete at this time.  Diet Order:   Diet Order            Diet NPO time specified  Diet effective midnight             EDUCATION NEEDS:   No  education needs have been identified at this time  Skin:  Skin Assessment: Reviewed RN Assessment  Last BM:  04/17/2019 per chart  Height:   Ht Readings from Last 1 Encounters:  04/18/19 5\' 1"  (1.549 m)   Weight:   Wt Readings from Last 1 Encounters:  04/18/19 55.2 kg   Ideal Body Weight:     BMI:  Body mass index is 22.99 kg/m.  Estimated Nutritional Needs:   Kcal:  1500-1700  Protein:  75-85 grams  Fluid:  1.5-1.7 L/day  Willey Blade, MS, RD, LDN Office: [phone] Pager: [phone] After Hours/Weekend Pager: [phone]

## 2019-04-18 NOTE — Progress Notes (Signed)
Patient returned to ICU bed 2.  VSS.  Right lateral puncture site covered with dressing.  Dressing clean dry and intact.  Patient denies pain.

## 2019-04-18 NOTE — Progress Notes (Signed)
Patient mother notified of patient room change.

## 2019-04-18 NOTE — Progress Notes (Signed)
eLink Physician-Brief Progress Note Patient Name: Abigail Burton DOB: August 06, 1976 MRN: [id number]   Date of Service  04/18/2019  HPI/Events of Note  43 yo F with hx of GI bleeding transferred to [facility] ICU due to recurrent hypotension, hemoglobin remains stable.  eICU Interventions  New patient evaluation completed.        [name] 04/18/2019, 12:31 AM

## 2019-04-18 NOTE — Progress Notes (Signed)
[place] [facility] [place] NAME: Abigail Burton    MR#:  [id number]  DATE OF BIRTH:  [birth year]  SUBJECTIVE:  CHIEF COMPLAINT:   Chief Complaint  Patient presents with  . Fatigue   No new complaint this morning.  No further bleeding overnight.  Notified that octreotide and Protonix drip already discontinued.    REVIEW OF SYSTEMS:  ROS CONSTITUTIONAL: No fever, fatigue or weakness.  EYES: No blurred or double vision.  EARS, NOSE, AND THROAT: No tinnitus or ear pain.  RESPIRATORY: No cough, shortness of breath, wheezing or hemoptysis.  CARDIOVASCULAR: No chest pain, orthopnea, edema.  GASTROINTESTINAL: No nausea, vomiting, diarrhea or abdominal pain.  GENITOURINARY: No dysuria, hematuria.  ENDOCRINE: No polyuria, nocturia,  HEMATOLOGY: No anemia, easy bruising or bleeding SKIN: No rash or lesion. MUSCULOSKELETAL: No joint pain or arthritis.   NEUROLOGIC: No tingling, numbness, weakness.  Tremulous unsteady gait PSYCHIATRY: No anxiety or depression.  DRUG ALLERGIES:   Allergies  Allergen Reactions  . Sulfa Antibiotics Other (See Comments)    Reaction: unknown   VITALS:  Blood pressure (!) 85/61, pulse 89, temperature 98.1 F (36.7 C), resp. rate 16, height 5\' 1"  (1.549 m), weight 55.2 kg, SpO2 94 %. PHYSICAL EXAMINATION:  Physical Exam  GENERAL:  43 y.o.-year-old patient lying in the bed with no acute distress.  EYES: Pupils equal, round, reactive to light and accommodation. No scleral icterus. Extraocular muscles intact.  HEENT: Head atraumatic, normocephalic. Oropharynx and nasopharynx clear.  NECK:  Supple, no jugular venous distention. No thyroid enlargement, no tenderness.  LUNGS: Normal breath sounds bilaterally, no wheezing, rales,rhonchi or crepitation. No use of accessory muscles of respiration.  CARDIOVASCULAR: S1, S2 normal. No murmurs, rubs, or gallops.  ABDOMEN: Soft, nontender, nondistended. Bowel sounds present. No  organomegaly or mass.  EXTREMITIES: No pedal edema, cyanosis, or clubbing.  NEUROLOGIC: Cranial nerves II through XII are intact. Muscle strength 5/5 in all extremities. Sensation intact. Gait not checked.  PSYCHIATRIC: The patient is alert and oriented x 3.  SKIN: No obvious rash, lesion, or ulcer.   LABORATORY PANEL:  Female CBC Recent Labs  Lab 04/18/19 0517  WBC 7.4  HGB 10.5*  HCT 30.8*  PLT 217   ------------------------------------------------------------------------------------------------------------------ Chemistries  Recent Labs  Lab 04/17/19 1428 04/17/19 2342 04/18/19 0517  NA  --  134* 136  K  --  4.1 4.2  CL  --  109 112*  CO2  --  21* 21*  GLUCOSE  --  68* 152*  BUN  --  5* <5*  CREATININE  --  0.81 0.87  CALCIUM  --  7.7* 7.7*  MG 1.6*  --   --   AST  --  80*  --   ALT  --  31  --   ALKPHOS  --  121  --   BILITOT  --  1.2  --    RADIOLOGY:  US Paracentesis  Result Date: 04/18/2019 INDICATION: Recurrent ascites EXAM: ULTRASOUND GUIDED PARACENTESIS MEDICATIONS: None. COMPLICATIONS: None immediate. PROCEDURE: Informed written consent was obtained from the patient after a discussion of the risks, benefits and alternatives to treatment. A timeout was performed prior to the initiation of the procedure. Initial ultrasound scanning demonstrates a large amount of ascites within the right lower abdominal quadrant. The right lower abdomen was prepped and draped in the usual sterile fashion. 1% lidocaine was used for local anesthesia. Following this, a 6 Fr Safe-T-Centesis catheter was introduced. An ultrasound  image was saved for documentation purposes. The paracentesis was performed. The catheter was removed and a dressing was applied. The patient tolerated the procedure well without immediate post procedural complication. FINDINGS: A total of approximately 3.2 L of clear yellow fluid was removed. Samples were sent to the laboratory as requested by the clinical team.  IMPRESSION: Successful ultrasound-guided paracentesis yielding 3.2 liters of peritoneal fluid. Electronically Signed   By: Inez Catalina M.D.   On: 04/18/2019 12:08   Dg Chest Port 1 View  Result Date: 04/18/2019 CLINICAL DATA:  Pneumonia EXAM: PORTABLE CHEST 1 VIEW COMPARISON:  07/19/2019 CT.  Chest x-ray 03/22/2019 FINDINGS: Right upper lobe/a focal density again noted, likely not significantly changed since prior CT. This is better visualized on CT. Lungs otherwise clear. Heart is normal size. No effusions. IMPRESSION: Right apical/upper lobe density likely unchanged since prior CT, better seen on prior CT. Electronically Signed   By: Rolm Baptise M.D.   On: 04/18/2019 11:03   ASSESSMENT AND PLAN:    Patient is 43 year old being admitted with hypotension possible GI bleed  1.  GI bleed No further bleeding overnight.  Octreotide drip and Protonix drip already discontinued Patient seen by gastroenterologist.  If hemoglobin remained stable by tomorrow EGD as outpatient but if hemoglobin drops will consider EGD as inpatient.   2.  Hypotension likely due to diuresis progressive liver disease we will continue IV fluids for now placed on midodrine Blood pressure stable this morning  3.  Tremulous shakiness I will check a B12 level on this patient.,  Check vitamin D level as well We will give her IV thiamine  4.  Liver cirrhosis complicated with ascites Diagnostic and therapeutic paracentesis to rule out SBP requested by gastroenterologist today.  5.  Abnormal CT scan of the chest with cavitary lesion needs outpatient PET scan and will have to re-be rescheduled.  6.  Chronic hepatic encephalopathy continue lactulose and Xifaxan has been started  DVT prophylaxis; SCDs   All the records are reviewed and case discussed with Care Management/Social Worker. Management plans discussed with the patient, family and they are in agreement.  CODE STATUS: Full Code  TOTAL TIME TAKING CARE OF  THIS PATIENT: 36 minutes.   More than 50% of the time was spent in counseling/coordination of care: YES  POSSIBLE D/C IN 2 DAYS, DEPENDING ON CLINICAL CONDITION.   [name] M.D on 04/18/2019 at 4:48 PM  Between 7am to 6pm - Pager - [phone]  After 6pm go to [email or web address] - password [email or web address] [facility]  [employer] [place] [facility]  Office  [phone]  CC: Primary care physician; Lavera Guise, PA-C  Note: This dictation was prepared with Dragon dictation along with smaller phrase technology. Any transcriptional errors that result from this process are unintentional.

## 2019-04-18 NOTE — Telephone Encounter (Signed)
Mother called to report that patient will be unable to come to apt today due to being in hospital in ICU.  She is having GI Bleeding and hypotension. Appointment has been cancelled

## 2019-04-19 LAB — CBC WITH DIFFERENTIAL/PLATELET
Abs Immature Granulocytes: 0.02 10*3/uL (ref 0.00–0.07)
Basophils Absolute: 0 10*3/uL (ref 0.0–0.1)
Basophils Relative: 1 %
Eosinophils Absolute: 0.3 10*3/uL (ref 0.0–0.5)
Eosinophils Relative: 4 %
HCT: 31.8 % — ABNORMAL LOW (ref 36.0–46.0)
Hemoglobin: 10.8 g/dL — ABNORMAL LOW (ref 12.0–15.0)
Immature Granulocytes: 0 %
Lymphocytes Relative: 30 %
Lymphs Abs: 2.2 10*3/uL (ref 0.7–4.0)
MCH: 36.6 pg — ABNORMAL HIGH (ref 26.0–34.0)
MCHC: 34 g/dL (ref 30.0–36.0)
MCV: 107.8 fL — ABNORMAL HIGH (ref 80.0–100.0)
Monocytes Absolute: 0.7 10*3/uL (ref 0.1–1.0)
Monocytes Relative: 9 %
Neutro Abs: 4.1 10*3/uL (ref 1.7–7.7)
Neutrophils Relative %: 56 %
Platelets: 216 10*3/uL (ref 150–400)
RBC: 2.95 MIL/uL — ABNORMAL LOW (ref 3.87–5.11)
RDW: 14 % (ref 11.5–15.5)
WBC: 7.3 10*3/uL (ref 4.0–10.5)
nRBC: 0 % (ref 0.0–0.2)

## 2019-04-19 LAB — COMPREHENSIVE METABOLIC PANEL
ALT: 25 U/L (ref 0–44)
AST: 67 U/L — ABNORMAL HIGH (ref 15–41)
Albumin: 2 g/dL — ABNORMAL LOW (ref 3.5–5.0)
Alkaline Phosphatase: 110 U/L (ref 38–126)
Anion gap: 1 — ABNORMAL LOW (ref 5–15)
BUN: <5 mg/dL — ABNORMAL LOW (ref 6–20)
CO2: 23 mmol/L (ref 22–32)
Calcium: 8.1 mg/dL — ABNORMAL LOW (ref 8.9–10.3)
Chloride: 113 mmol/L — ABNORMAL HIGH (ref 98–111)
Creatinine, Ser: 0.74 mg/dL (ref 0.44–1.00)
GFR calc Af Amer: >60 mL/min (ref >60)
GFR calc non Af Amer: >60 mL/min (ref >60)
Glucose, Bld: 118 mg/dL — ABNORMAL HIGH (ref 70–99)
Potassium: 3.6 mmol/L (ref 3.5–5.1)
Sodium: 137 mmol/L (ref 135–145)
Total Bilirubin: 1.2 mg/dL (ref 0.3–1.2)
Total Protein: 5 g/dL — ABNORMAL LOW (ref 6.5–8.1)

## 2019-04-19 LAB — CYTOLOGY - NON PAP

## 2019-04-19 LAB — URINE CULTURE

## 2019-04-19 LAB — AMMONIA: Ammonia: 49 umol/L — ABNORMAL HIGH (ref 9–35)

## 2019-04-19 LAB — PROTEIN, BODY FLUID (OTHER): Total Protein, Body Fluid Other: 0.8 g/dL

## 2019-04-19 LAB — ALBUMIN, FLUID (OTHER): Albumin, Body Fluid Other: 0.4 g/dL

## 2019-04-19 LAB — CALCITRIOL (1,25 DI-OH VIT D): Vit D, 1,25-Dihydroxy: 16.9 pg/mL — ABNORMAL LOW (ref 19.9–79.3)

## 2019-04-19 LAB — MAGNESIUM: Magnesium: 1.8 mg/dL (ref 1.7–2.4)

## 2019-04-19 MED ORDER — PANTOPRAZOLE SODIUM 40 MG PO TBEC: 40.0000 mg | DELAYED_RELEASE_TABLET | Freq: Every day | ORAL | 0 refills | Status: DC

## 2019-04-19 MED ORDER — RIFAXIMIN 550 MG PO TABS: 550.0000 mg | ORAL_TABLET | Freq: Two times a day (BID) | ORAL | 0 refills | Status: AC

## 2019-04-19 NOTE — Progress Notes (Signed)
Pt is being discharged home.  Discharge papers given and explained to pt.  Pt verbalized understanding.  Meds and f/u appointment reviewed.  Rx given. Awaiting transportation.  

## 2019-04-19 NOTE — TOC Transition Note (Signed)
Transition of Care [facility]) - CM/SW Discharge Note   Patient Details  Name: Abigail Burton MRN: [id number] Date of Birth: Jul 20, 1976  Transition of Care [facility] CM/SW Contact:  Abigail Burton, Abigail Burton Phone Number: [phone] 04/19/2019, 4:27 PM   Clinical Narrative: Clinical Social Worker (CSW) met with patient to discuss D/C plan. Patient reported that she lives in [place] with her mother Abigail Burton. Patient reported that she has signed up for [facility] care and gets her medication through [facility]. Per patient [facility] pharmacy gives her a discount on her medications. Patient reported no other needs or concerns. Please reconsult if future social work needs arise. CSW signing off.      Final next level of care: Home/Self Care Barriers to Discharge: Barriers Resolved   Patient Goals and CMS Choice Patient states their goals for this hospitalization and ongoing recovery are:: To go home.      Discharge Placement                       Discharge Plan and Services In-house Referral: Clinical Social Work              DME Arranged: N/A         HH Arranged: NA          Social Determinants of Health (SDOH) Interventions     Readmission Risk Interventions No flowsheet data found.

## 2019-04-19 NOTE — Discharge Summary (Signed)
Abigail Burton at [facility] NAME: Abigail Burton    MR#:  [id number]  DATE OF BIRTH:  Feb 13, 1976  DATE OF ADMISSION:  04/17/2019   ADMITTING PHYSICIAN: Dustin Flock, MD  DATE OF DISCHARGE: 04/19/2019  PRIMARY CARE PHYSICIAN: Lavera Guise, PA-C   ADMISSION DIAGNOSIS:  Melena [K92.1] Weakness [id number] Alcoholic cirrhosis of liver with ascites ([place]) [K70.31] DISCHARGE DIAGNOSIS:  Active Problems:   Hypotension  SECONDARY DIAGNOSIS:   Past Medical History:  Diagnosis Date  . Alcohol dependence ([place])   . Asthma   . Cancer ([place])    left shoulder melonoma with excision  . DDD (degenerative disc disease), lumbosacral   . Hepatitis C   . Hypertension   . Renal disorder    HOSPITAL COURSE:  Chief complaint; fatigue   History of presenting complaint; Abigail Burton  is a 43 y.o. female with a known history of liver cirrhosis, hepatitis C, hypertension, abnormal CT scan of the chest who initially brought the patient due to being very tremulous and unsteady on her feet.  However patient in the emergency room is noted to have blood pressures in the 70s.  She has received IV fluids.  Also patient had called her GI physician as outpatient told him that she was having bleeding.  Patient was admitted for further evaluation.  Hospital course; 1.GI bleed No further bleeding over the last 2 days. Octreotide drip and Protonix drip previously discontinued Patient seen by gastroenterologist.  Hemoglobin remained stable at 10.8.  Patient denies having any black stools.  Stool is brown.  I discussed with gastroenterologist Dr. Vicente Males he will has given clearance to discharge patient since no further bleeds to follow-up as outpatient for outpatient EGD. 2.Hypotension; resolved With no further resuming Lasix and spironolactone until patient is reevaluated by primary care physician to ensure blood pressure remained stable before resuming   3.Tremulous and shakiness; improved Likely due to underlying liver disease.  Vitamin B12 level of 1056.   4.Liver cirrhosis complicated with ascites Diagnostic and therapeutic paracentesis was done.  So far no growth on ascitic fluid  5.Abnormal CT scan of the chest with cavitary lesion needs outpatient PET scan and will have to re-be rescheduled. Follow with oncology as outpatient  6.Chronic hepatic encephalopathy; continue lactulose and Xifaxan on discharge  DISCHARGE CONDITIONS:  Stable CONSULTS OBTAINED:  Treatment Team:  Lin Landsman, MD Jonathon Bellows, MD DRUG ALLERGIES:   Allergies  Allergen Reactions  . Sulfa Antibiotics Other (See Comments)    Reaction: unknown   DISCHARGE MEDICATIONS:   Allergies as of 04/19/2019      Reactions   Sulfa Antibiotics Other (See Comments)   Reaction: unknown      Medication List    STOP taking these medications   furosemide 20 MG tablet Commonly known as: LASIX   spironolactone 50 MG tablet Commonly known as: ALDACTONE     TAKE these medications   buPROPion 300 MG 24 hr tablet Commonly known as: WELLBUTRIN XL Take 300 mg by mouth daily.   gabapentin 300 MG capsule Commonly known as: NEURONTIN Take 900 mg by mouth 3 (three) times daily.   lactulose 10 GM/15ML solution Commonly known as: CHRONULAC Take 45 mLs (30 g total) by mouth daily.   pantoprazole 40 MG tablet Commonly known as: Protonix Take 1 tablet (40 mg total) by mouth daily.   rifaximin 550 MG Tabs tablet Commonly known as: XIFAXAN Take 1 tablet (550 mg total) by mouth 2 (  two) times daily.   sertraline 50 MG tablet Commonly known as: ZOLOFT Take 50 mg by mouth 2 (two) times a day.   trazodone 300 MG tablet Commonly known as: DESYREL Take 300 mg by mouth at bedtime as needed for sleep.        DISCHARGE INSTRUCTIONS:   DIET:  Low sodium diet DISCHARGE CONDITION:  Stable ACTIVITY:  Activity as tolerated OXYGEN:  Home Oxygen:  No.  Oxygen Delivery: room air DISCHARGE LOCATION:  home   If you experience worsening of your admission symptoms, develop shortness of breath, life threatening emergency, suicidal or homicidal thoughts you must seek medical attention immediately by calling 911 or calling your MD immediately  if symptoms less severe.  You Must read complete instructions/literature along with all the possible adverse reactions/side effects for all the Medicines you take and that have been prescribed to you. Take any new Medicines after you have completely understood and accpet all the possible adverse reactions/side effects.   Please note  You were cared for by a hospitalist during your hospital stay. If you have any questions about your discharge medications or the care you received while you were in the hospital after you are discharged, you can call the unit and asked to speak with the hospitalist on call if the hospitalist that took care of you is not available. Once you are discharged, your primary care physician will handle any further medical issues. Please note that NO REFILLS for any discharge medications will be authorized once you are discharged, as it is imperative that you return to your primary care physician (or establish a relationship with a primary care physician if you do not have one) for your aftercare needs so that they can reassess your need for medications and monitor your lab values.    On the day of Discharge:  VITAL SIGNS:  Blood pressure 107/80, pulse 80, temperature 98.1 F (36.7 C), temperature source Oral, resp. rate 18, height 5\' 1"  (1.549 m), weight 55.2 kg, SpO2 98 %. PHYSICAL EXAMINATION:  GENERAL:  43 y.o.-year-old patient lying in the bed with no acute distress.  EYES: Pupils equal, round, reactive to light and accommodation. No scleral icterus. Extraocular muscles intact.  HEENT: Head atraumatic, normocephalic. Oropharynx and nasopharynx clear.  NECK:  Supple, no jugular  venous distention. No thyroid enlargement, no tenderness.  LUNGS: Normal breath sounds bilaterally, no wheezing, rales,rhonchi or crepitation. No use of accessory muscles of respiration.  CARDIOVASCULAR: S1, S2 normal. No murmurs, rubs, or gallops.  ABDOMEN: Soft, non-tender, non-distended. Bowel sounds present. No organomegaly or mass.  EXTREMITIES: No pedal edema, cyanosis, or clubbing.  NEUROLOGIC: Cranial nerves II through XII are intact. Muscle strength 5/5 in all extremities. Sensation intact. Gait not checked.  PSYCHIATRIC: The patient is alert and oriented x 3.  SKIN: No obvious rash, lesion, or ulcer.  DATA REVIEW:   CBC Recent Labs  Lab 04/19/19 0638  WBC 7.3  HGB 10.8*  HCT 31.8*  PLT 216    Chemistries  Recent Labs  Lab 04/19/19 0638  NA 137  K 3.6  CL 113*  CO2 23  GLUCOSE 118*  BUN <5*  CREATININE 0.74  CALCIUM 8.1*  MG 1.8  AST 67*  ALT 25  ALKPHOS 110  BILITOT 1.2     Microbiology Results  Results for orders placed or performed during the hospital encounter of 04/17/19  Urine Culture     Status: Abnormal   Collection Time: 04/17/19  2:28 PM  Specimen: Urine, Clean Catch  Result Value Ref Range Status   Specimen Description   Final    URINE, CLEAN CATCH Performed at [facility], [address] [place], [place] [address]    Special Requests   Final    NONE Performed at [facility], [address] [place], [place] [address]    Culture MULTIPLE SPECIES PRESENT, SUGGEST RECOLLECTION (A)  Final   Report Status 04/19/2019 FINAL  Final  SARS Coronavirus 2 [facility] order, Performed in [facility] hospital lab) Nasopharyngeal Nasopharyngeal Swab     Status: None   Collection Time: 04/17/19  4:46 PM   Specimen: Nasopharyngeal Swab  Result Value Ref Range Status   SARS Coronavirus 2 NEGATIVE NEGATIVE Final    Comment: (NOTE) If result is NEGATIVE SARS-CoV-2 target nucleic acids are NOT DETECTED. The SARS-CoV-2 RNA is  generally detectable in upper and lower  respiratory specimens during the acute phase of infection. The lowest  concentration of SARS-CoV-2 viral copies this assay can detect is 250  copies / mL. A negative result does not preclude SARS-CoV-2 infection  and should not be used as the sole basis for treatment or other  patient management decisions.  A negative result may occur with  improper specimen collection / handling, submission of specimen other  than nasopharyngeal swab, presence of viral mutation(s) within the  areas targeted by this assay, and inadequate number of viral copies  (<250 copies / mL). A negative result must be combined with clinical  observations, patient history, and epidemiological information. If result is POSITIVE SARS-CoV-2 target nucleic acids are DETECTED. The SARS-CoV-2 RNA is generally detectable in upper and lower  respiratory specimens dur ing the acute phase of infection.  Positive  results are indicative of active infection with SARS-CoV-2.  Clinical  correlation with patient history and other diagnostic information is  necessary to determine patient infection status.  Positive results do  not rule out bacterial infection or co-infection with other viruses. If result is PRESUMPTIVE POSTIVE SARS-CoV-2 nucleic acids MAY BE PRESENT.   A presumptive positive result was obtained on the submitted specimen  and confirmed on repeat testing.  While 2019 novel coronavirus  (SARS-CoV-2) nucleic acids may be present in the submitted sample  additional confirmatory testing may be necessary for epidemiological  and / or clinical management purposes  to differentiate between  SARS-CoV-2 and other Sarbecovirus currently known to infect humans.  If clinically indicated additional testing with an alternate test  methodology [id number]) is advised. The SARS-CoV-2 RNA is generally  detectable in upper and lower respiratory sp ecimens during the acute  phase of infection.  The expected result is Negative. Fact Sheet for Patients:  StrictlyIdeas.no Fact Sheet for Healthcare Providers: BankingDealers.co.za This test is not yet approved or cleared by the [place] FDA and has been authorized for detection and/or diagnosis of SARS-CoV-2 by FDA under an Emergency Use Authorization (EUA).  This EUA will remain in effect (meaning this test can be used) for the duration of the COVID-19 declaration under Section 564(b)(1) of the Act, 21 U.S.C. section 360bbb-3(b)(1), unless the authorization is terminated or revoked sooner. Performed at [facility], [address] [place], [place] [address]   MRSA PCR Screening     Status: None   Collection Time: 04/18/19 12:34 AM   Specimen: Nasopharyngeal  Result Value Ref Range Status   MRSA by PCR NEGATIVE NEGATIVE Final    Comment:        The GeneXpert  MRSA Assay (FDA approved for NASAL specimens only), is one component of a comprehensive MRSA colonization surveillance program. It is not intended to diagnose MRSA infection nor to guide or monitor treatment for MRSA infections. Performed at [facility], [address] [place], [place] [address]   CULTURE, BLOOD (ROUTINE X 2) w Reflex to ID Panel     Status: None (Preliminary result)   Collection Time: 04/18/19  8:02 AM   Specimen: BLOOD RIGHT HAND  Result Value Ref Range Status   Specimen Description BLOOD RIGHT HAND  Final   Special Requests   Final    BOTTLES DRAWN AEROBIC AND ANAEROBIC Blood Culture adequate volume   Culture   Final    NO GROWTH 1 DAY Performed at [facility], [address], [place], [place] [address]    Report Status PENDING  Incomplete  CULTURE, BLOOD (ROUTINE X 2) w Reflex to ID Panel     Status: None (Preliminary result)   Collection Time: 04/18/19  8:10 AM   Specimen: BLOOD RIGHT FOREARM  Result Value Ref Range Status   Specimen Description BLOOD  RIGHT FOREARM  Final   Special Requests   Final    BOTTLES DRAWN AEROBIC AND ANAEROBIC Blood Culture adequate volume   Culture   Final    NO GROWTH 1 DAY Performed at [facility], [address], [place], [place] [address]    Report Status PENDING  Incomplete  Body fluid culture     Status: None (Preliminary result)   Collection Time: 04/18/19 11:30 AM   Specimen: [facility] Cytology Peritoneal fluid; Body Fluid  Result Value Ref Range Status   Specimen Description   Final    PERITONEAL Performed at [facility], [address] [place], [place] [address]    Special Requests   Final    NONE Performed at [facility], [address] [place], [place] [address]    Gram Stain   Final    RARE WBC PRESENT, PREDOMINANTLY MONONUCLEAR NO ORGANISMS SEEN Performed at [facility], [place] [address] [place], [place] [address]    Culture PENDING  Incomplete   Report Status PENDING  Incomplete    RADIOLOGY:  US Paracentesis  Result Date: 04/18/2019 INDICATION: Recurrent ascites EXAM: ULTRASOUND GUIDED PARACENTESIS MEDICATIONS: None. COMPLICATIONS: None immediate. PROCEDURE: Informed written consent was obtained from the patient after a discussion of the risks, benefits and alternatives to treatment. A timeout was performed prior to the initiation of the procedure. Initial ultrasound scanning demonstrates a large amount of ascites within the right lower abdominal quadrant. The right lower abdomen was prepped and draped in the usual sterile fashion. 1% lidocaine was used for local anesthesia. Following this, a 6 Fr Safe-T-Centesis catheter was introduced. An ultrasound image was saved for documentation purposes. The paracentesis was performed. The catheter was removed and a dressing was applied. The patient tolerated the procedure well without immediate post procedural complication. FINDINGS: A total of approximately 3.2 L of clear yellow fluid was removed. Samples were  sent to the laboratory as requested by the clinical team. IMPRESSION: Successful ultrasound-guided paracentesis yielding 3.2 liters of peritoneal fluid. Electronically Signed   By: Inez Catalina M.D.   On: 04/18/2019 12:08     Management plans discussed with the patient, family and they are in agreement.  CODE STATUS: Full Code   TOTAL TIME TAKING CARE OF THIS PATIENT: 35 minutes.    Katrinia Straker M.D on 04/19/2019 at 11:57 AM  Between 7am to 6pm - Pager - [phone]  After 6pm go to [email or web address] - password [email or web address] [facility]  [facility]  Office  [phone]  CC: Primary care physician; Lavera Guise, PA-C   Note: This dictation was prepared with Dragon dictation along with smaller phrase technology. Any transcriptional errors that result from this process are unintentional.

## 2019-04-22 ENCOUNTER — Other Ambulatory Visit: Payer: Self-pay | Admitting: Student

## 2019-04-22 DIAGNOSIS — K7031 Alcoholic cirrhosis of liver with ascites: Secondary | ICD-10-CM

## 2019-04-22 LAB — BODY FLUID CULTURE: Culture: NO GROWTH

## 2019-04-23 LAB — CULTURE, BLOOD (ROUTINE X 2)
Culture: NO GROWTH
Culture: NO GROWTH
Special Requests: ADEQUATE
Special Requests: ADEQUATE

## 2019-04-25 NOTE — Progress Notes (Signed)
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 performed at [facility], [address], [place], [place] [address] Lab: [id number] Dir: Alyson Reedy, MD, MMM  Professional component performed at [facility], [facility], [place], [place], [place] [address] Lab: [phone] Dir: Dellia Nims. Reuel Derby, MD   . Source of Sample 04/18/2019 PERITONEAL    Final   Performed at [facility], [address] [place], [place] [address]  . Albumin, Body Fluid Other 04/18/2019 0.4  Not Estab. g/dL Final   Comment: (NOTE) The reference intervals and other method performance specifications have not been established for this test. The test result should be integrated into the clinical context for interpretation. The reference interval(s) and other method performance specifications have not been established for this body fluid. The test result must be integrated into the clinical context for interpretation. Performed At: [facility] [place], [place] [id number] Rush Farmer MD [id number]   . Fluid Type-FCT 04/18/2019 PERITONEAL   Corrected   CORRECTED ON 08/20 AT 1158: PREVIOUSLY  REPORTED AS CYTOPERI  . Color, Fluid 04/18/2019 YELLOW* YELLOW Final  . Appearance, Fluid 04/18/2019 CLEAR  CLEAR Final  . WBC, Fluid 04/18/2019 150  cu mm Final  . Neutrophil Count, Fluid 04/18/2019 13  % Final  . Lymphs, Fluid 04/18/2019 23  % Final  . Monocyte-Macrophage-Serous Fluid 04/18/2019 64  % Final  . Eos, Fluid 04/18/2019 0  % Final   Performed at [facility], [address] [place], [place] [address]  . Specimen Description 04/18/2019    Final                   Value:PERITONEAL Performed at [facility], [address] [place], [place] [address]   . Special Requests 04/18/2019    Final                   Value:NONE Performed at [facility], [address] [place], [place] [address]   . Gram Stain 04/18/2019    Final                   Value:RARE WBC PRESENT, PREDOMINANTLY MONONUCLEAR NO ORGANISMS SEEN   . Culture 04/18/2019    Final                   Value:NO GROWTH 3 DAYS Performed at [facility], [place] [address] [place], [place] [address]   . Report Status 04/18/2019 04/22/2019 FINAL   Final  . Total Protein, Body Fluid Other 04/18/2019 0.8  g/dL Final   Comment:  (NOTE)                            _________________________________________                           : BODY FLUID TYPE :     TOTAL PROTEIN     :                           :_________________:_______________________:                           : Amniotic Fluid  :               <0.4    :                           :_________________:_______________________:                           :                 : Nonmalignant: <3.0    :                           : Ascitic Fluid   : Malignant:    >3.0    :                           :_________________:_______________________:                           : Bile, Clear     :               <  0.9    :                           :_________________:_______________________:                           : Bile, Yellow    :          0.2 - 0.6    :                           :_________________:_______________________:                           : Lymph           :          2.2 - 6.0    :                           :_________________:_______________________:                                                     : Human Milk      :          1.9 - 2.0    :                           :_________________: ______________________:                           : Nasal Secretion :          0.1 - 3.5    :                           :_________________:_______________________:                           : Pancreatic      :          0.0 - 0.1    :                           : Juice           :    (post stimulation) :                           :_________________:_______________________:                           :                 : Transudate:   <0.3    :                           : Pleural Fluid   : Exudate:      >0.3    :                           :  _________________:_______________________:                           : Saliva          :          0.1 - 0.2    :                           : (Mixed Glands)  :                       :                           :_________________:_______________________:                            : Synovial Fluid  :               <2.5    :                                                     :_________________:_______________________:                           : Tears           :          0.8 - 0.9    :                           :_________________:_______________________:                            Louanne Skye, Karene Fry. Reference Intervals                            for  Adults and Children 2008. Ninth                            Edition (V9.1) [place],                            [place]; [place]: July 2009. The method performance specifications have not been established for this test in body fluid.  The test result should be integrated into the clinical context for interpretation. Performed At: [facility] [place], [place] [id number] Rush Farmer MD [id number]   . Source of Sample 04/18/2019 PERITONEAL   Final   Performed at [facility], [address] [place], [place] [address]  . WBC 04/19/2019 7.3  4.0 - 10.5 K/uL Final  . RBC 04/19/2019 2.95* 3.87 - 5.11 MIL/uL Final  . Hemoglobin 04/19/2019 10.8* 12.0 - 15.0 g/dL Final  . HCT 04/19/2019 31.8* 36.0 - 46.0 % Final  . MCV 04/19/2019 107.8* 80.0 - 100.0 fL Final  . MCH 04/19/2019 36.6* 26.0 - 34.0 pg Final  . MCHC 04/19/2019 34.0  30.0 - 36.0 g/dL Final  . RDW 04/19/2019 14.0  11.5 - 15.5 %  Final  . Platelets 04/19/2019 216  150 - 400 K/uL Final  . nRBC 04/19/2019 0.0  0.0 - 0.2 % Final  . Neutrophils Relative % 04/19/2019 56  % Final  . Neutro Abs 04/19/2019 4.1  1.7 - 7.7 K/uL Final  . Lymphocytes Relative 04/19/2019 30  % Final  . Lymphs Abs 04/19/2019 2.2  0.7 - 4.0 K/uL Final  . Monocytes Relative 04/19/2019 9  % Final  . Monocytes Absolute 04/19/2019 0.7  0.1 - 1.0 K/uL Final  . Eosinophils Relative 04/19/2019 4  % Final  . Eosinophils Absolute 04/19/2019 0.3  0.0 - 0.5 K/uL Final  . Basophils Relative 04/19/2019 1  % Final  .  Basophils Absolute 04/19/2019 0.0  0.0 - 0.1 K/uL Final  . Immature Granulocytes 04/19/2019 0  % Final  . Abs Immature Granulocytes 04/19/2019 0.02  0.00 - 0.07 K/uL Final   Performed at [facility], [address] [place], [place] [address]  . Sodium 04/19/2019 137  135 - 145 mmol/L Final   LYTES REPEATED JJB  . Potassium 04/19/2019 3.6  3.5 - 5.1 mmol/L Final  . Chloride 04/19/2019 113* 98 - 111 mmol/L Final  . CO2 04/19/2019 23  22 - 32 mmol/L Final  . Glucose, Bld 04/19/2019 118* 70 - 99 mg/dL Final  . BUN 04/19/2019 <5* 6 - 20 mg/dL Final  . Creatinine, Ser 04/19/2019 0.74  0.44 - 1.00 mg/dL Final  . Calcium 04/19/2019 8.1* 8.9 - 10.3 mg/dL Final  . Total Protein 04/19/2019 5.0* 6.5 - 8.1 g/dL Final  . Albumin 04/19/2019 2.0* 3.5 - 5.0 g/dL Final  . AST 04/19/2019 67* 15 - 41 U/L Final  . ALT 04/19/2019 25  0 - 44 U/L Final  . Alkaline Phosphatase 04/19/2019 110  38 - 126 U/L Final  . Total Bilirubin 04/19/2019 1.2  0.3 - 1.2 mg/dL Final  . GFR calc non Af Amer 04/19/2019 >60  >60 mL/min Final  . GFR calc Af Amer 04/19/2019 >60  >60 mL/min Final  . Anion gap 04/19/2019 1* 5 - 15 Final   Performed at [facility], [address] [place], [place] [address]  . Magnesium 04/19/2019 1.8  1.7 - 2.4 mg/dL Final   Performed at [facility], [address] [place], [place] [address]  . Ammonia 04/19/2019 49* 9 - 35 umol/L Final   Performed at [facility], [address] [place], [place] [address]    Assessment:  Abigail Burton is a 43 y.o. female with right upper lobe nodular lesion.  Chest CT on 04/09/2019 revealed right upper lobe 7 cm linear area of scarring tracking from the apex to the hilum, with an indeterminate nodular and partially cavitary component in its mid section measuring up to 12 mm in thickness. There was mild pulmonary hyperinflation and minor lung scarring elsewhere.  Quantiferon-TB gold was negative on 04/09/2019.   Abdomen and pelvis CT on 04/08/2019 revealed cirrhosis, small perigastric varices, and moderate abdominopelvis ascites.  There were multiple bilateral ovarian cysts/follicles, measuring up to 5.8.cm on the left.  Pelvic ultrasound in 6-10 weeks was recommended if clinically warranted.  Paracentesis on 04/18/2019 revealed 3L of fluid.  Cytology and culture were negative.  She has a macrocytic anemia.  Etiology is likely due to underlying liver disease and +/- vitamin deficiencies.  She has a history of alcoholic cirrhosis with ascites, hepatitis C, and portal vein thrombosis.  She has undergone large volume paracentesis x 4 (next scheduled 04/29/2019).  Symptomatically, she has abdominal discomfort from ascites.  She has chronic shortness of breath.  She had an episode of blood streaked emesis yesterday.  Plan: 1. Lung lesion    Images personally reviewed.  Discuss concern for possible malignancy.  Discuss plan for PET scan.   Patient declines scheduling at [facility].  She wishes that all of her care be at [facility].   Discuss need for referral from her PCP, Jeanelle Malling, [place], who is part of [facility].  Discuss likely biopsy if RUL lesion is hypermetabolic.  Briefly review management of lung cancer.   Unclear if she would be a candidate for surgical resection if early stage NSCLC given her underlying liver disease. 2. Cirrhosis  Patient has cirrhosis requiring frequent large volume paracentesis.  Discuss recent scant blood in emesis and risk for bleeding esophageal varices.   Encourage follow-up with GI.  Discuss need to seek immediate medical attention if hematemesis or black stools. 3.   RTC based on patient's desire for care in [facility] vs [facility].  I discussed the assessment and treatment plan with the patient.  The patient was provided an opportunity to ask questions and all were answered.  The patient agreed with the plan and demonstrated an understanding of the instructions.  The patient was  advised to call back if the symptoms worsen or if the condition fails to improve as anticipated.   Melissa C. Mike Gip, MD, PhD    04/26/2019, 3:15 PM

## 2019-04-26 ENCOUNTER — Other Ambulatory Visit: Payer: Self-pay

## 2019-04-26 ENCOUNTER — Inpatient Hospital Stay: Payer: Self-pay | Attending: Hematology and Oncology | Admitting: Hematology and Oncology

## 2019-04-26 ENCOUNTER — Encounter: Payer: Self-pay | Admitting: Hematology and Oncology

## 2019-04-26 VITALS — BP 115/84 | HR 81 | Temp 97.3°F | Resp 18 | Ht 61.0 in | Wt 117.1 lb

## 2019-04-26 DIAGNOSIS — R911 Solitary pulmonary nodule: Secondary | ICD-10-CM | POA: Insufficient documentation

## 2019-04-26 DIAGNOSIS — F17210 Nicotine dependence, cigarettes, uncomplicated: Secondary | ICD-10-CM | POA: Insufficient documentation

## 2019-04-26 DIAGNOSIS — D539 Nutritional anemia, unspecified: Secondary | ICD-10-CM | POA: Insufficient documentation

## 2019-04-29 ENCOUNTER — Ambulatory Visit
Admission: RE | Admit: 2019-04-29 | Discharge: 2019-04-29 | Disposition: A | Discharge status: Home / Self Care | Payer: Self-pay | Source: Ambulatory Visit | Attending: Student | Admitting: Student

## 2019-05-03 MED ORDER — ACETAMINOPHEN 325 MG PO TABS
650.00 | ORAL_TABLET | ORAL | Status: DC
Start: ? — End: 2019-05-03

## 2019-05-03 MED ORDER — SERTRALINE HCL 100 MG PO TABS
100.00 | ORAL_TABLET | ORAL | Status: DC
Start: 2019-05-03 — End: 2019-05-03

## 2019-05-03 MED ORDER — ONDANSETRON 4 MG PO TBDP
4.00 | ORAL_TABLET | ORAL | Status: DC
Start: ? — End: 2019-05-03

## 2019-05-03 MED ORDER — NICOTINE POLACRILEX 2 MG MT LOZG
2.00 | LOZENGE | OROMUCOSAL | Status: DC
Start: ? — End: 2019-05-03

## 2019-05-03 MED ORDER — GENERIC EXTERNAL MEDICATION
2.00 | Status: DC
Start: ? — End: 2019-05-03

## 2019-05-03 MED ORDER — NICOTINE POLACRILEX 2 MG MT GUM
2.00 | CHEWING_GUM | OROMUCOSAL | Status: DC
Start: ? — End: 2019-05-03

## 2019-05-03 MED ORDER — PANTOPRAZOLE SODIUM 40 MG PO TBEC
40.00 | DELAYED_RELEASE_TABLET | ORAL | Status: DC
Start: 2019-05-03 — End: 2019-05-03

## 2019-05-03 MED ORDER — MELATONIN 3 MG PO TABS
3.00 | ORAL_TABLET | ORAL | Status: DC
Start: ? — End: 2019-05-03

## 2019-05-03 MED ORDER — LACTULOSE 10 GM/15ML PO SOLN
30.00 | ORAL | Status: DC
Start: 2019-05-03 — End: 2019-05-03

## 2019-05-03 MED ORDER — GABAPENTIN 300 MG PO CAPS
300.00 | ORAL_CAPSULE | ORAL | Status: DC
Start: 2019-05-03 — End: 2019-05-03

## 2019-05-03 MED ORDER — GENERIC EXTERNAL MEDICATION
60.00 | Status: DC
Start: 2019-05-03 — End: 2019-05-03

## 2019-05-03 MED ORDER — GENERIC EXTERNAL MEDICATION
150.00 | Status: DC
Start: 2019-05-03 — End: 2019-05-03

## 2019-05-03 MED ORDER — BUPROPION HCL ER (XL) 300 MG PO TB24
300.00 | ORAL_TABLET | ORAL | Status: DC
Start: 2019-05-03 — End: 2019-05-03

## 2019-05-03 MED ORDER — POLYETHYLENE GLYCOL 3350 17 G PO PACK
17.00 | PACK | ORAL | Status: DC
Start: ? — End: 2019-05-03

## 2019-05-03 MED ORDER — NICOTINE 14 MG/24HR TD PT24
1.00 | MEDICATED_PATCH | TRANSDERMAL | Status: DC
Start: 2019-05-03 — End: 2019-05-03

## 2019-05-09 ENCOUNTER — Ambulatory Visit
Admission: RE | Admit: 2019-05-09 | Discharge: 2019-05-09 | Disposition: A | Discharge status: Home / Self Care | Payer: Self-pay | Source: Ambulatory Visit | Attending: Student | Admitting: Student

## 2019-05-20 ENCOUNTER — Ambulatory Visit: Payer: Self-pay | Attending: Student

## 2020-01-26 IMAGING — CT CT HEAD WITHOUT CONTRAST
4 of 11 series · 14 of 47 positions shown, 15 images · non-contrast
Comparison: None.

CLINICAL DATA: Head trauma.  Laceration above right eyebrow.

EXAM:
CT HEAD WITHOUT CONTRAST
CT CERVICAL SPINE WITHOUT CONTRAST
TECHNIQUE: Multidetector CT imaging of the head and cervical spine was
performed following the standard protocol without intravenous
contrast. Multiplanar CT image reconstructions of the cervical spine
were also generated.

[Series 2: head wo · axial · 0.39mm/px · z∈[-161,-26]mm · 3 of 28 slices shown, 4 images]
[im 1/28  brain]
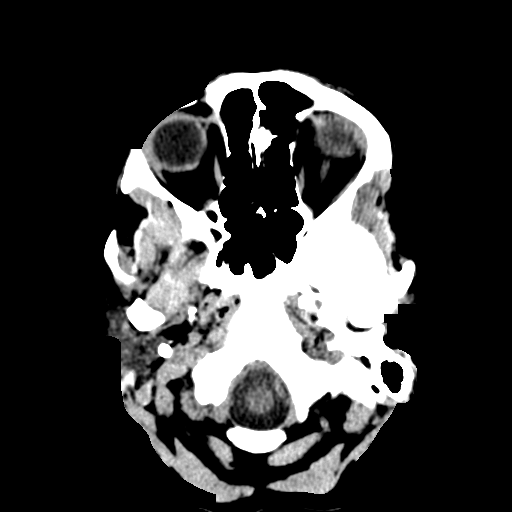
[im 1/28  bone]
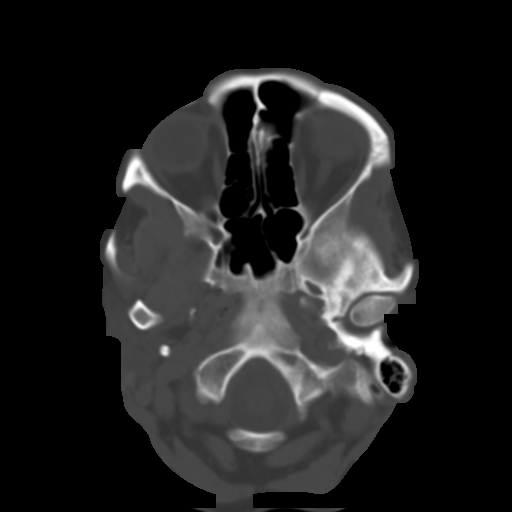
[im 14/28  brain]
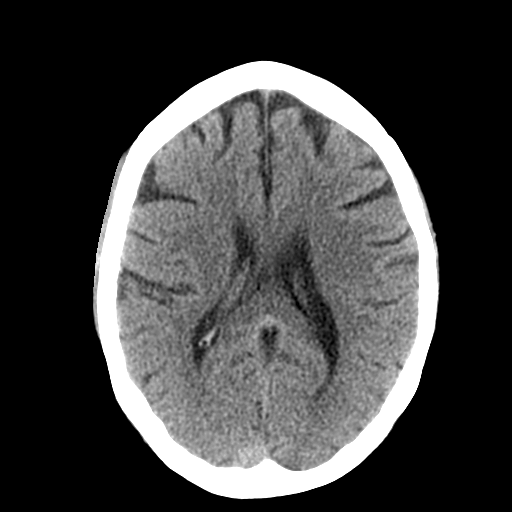
[im 28/28  brain]
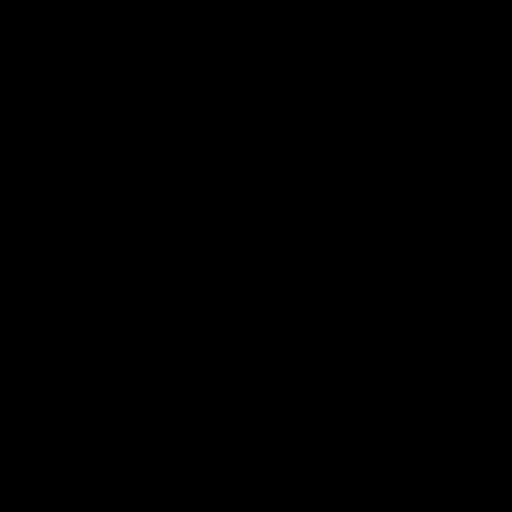

[Series 8: coronal soft tissue · coronal · 0.27mm/px · 2 of 62 slices shown]
[im 21/62  brain]
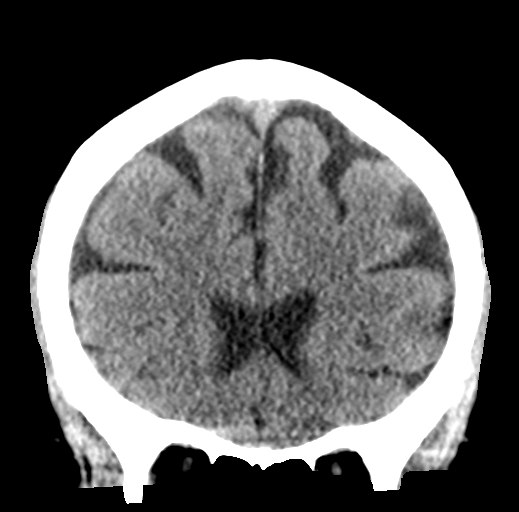
[im 41/62  brain]
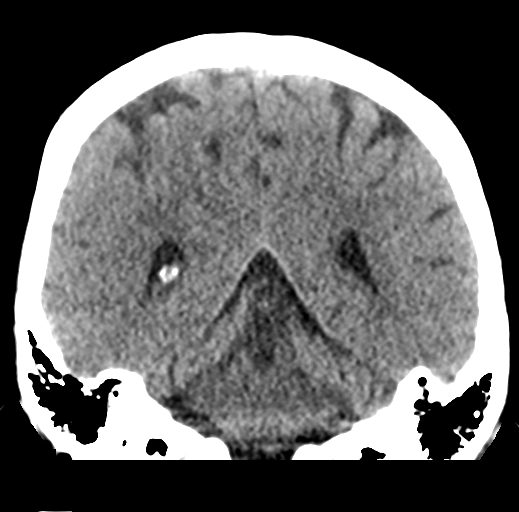

[Series 10: sagittal bone · sagittal · 0.18mm/px · 1 of 58 slices shown]
[im 29/58  brain]
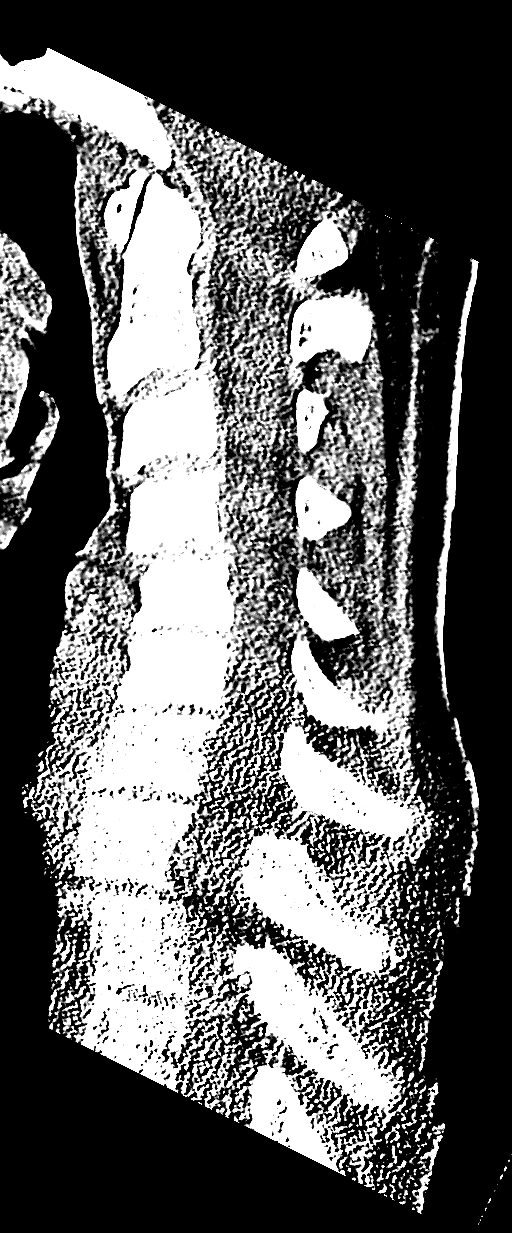

[Series 12: orthogonal bone · axial · 0.18mm/px · z∈[-326,-168]mm · 8 of 111 slices shown]
[im 12/111  bone]
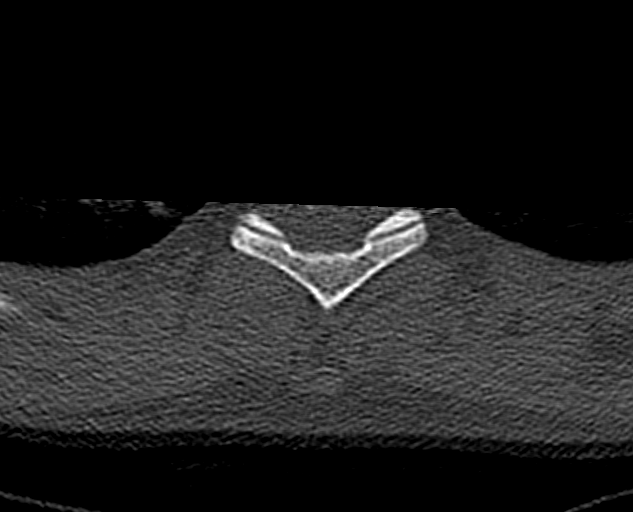
[im 23/111  bone]
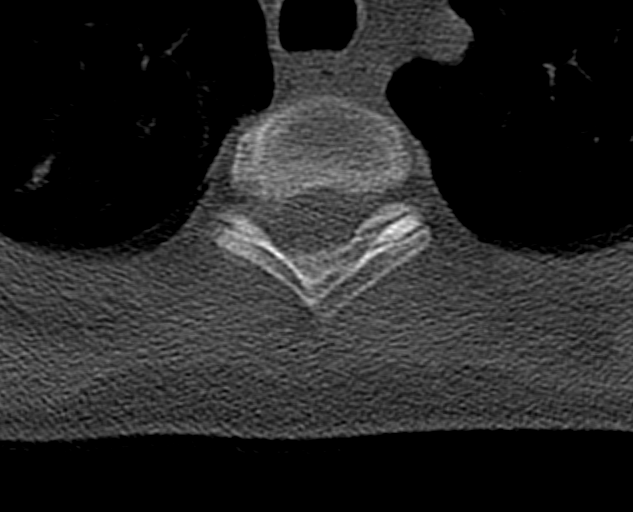
[im 34/111  bone]
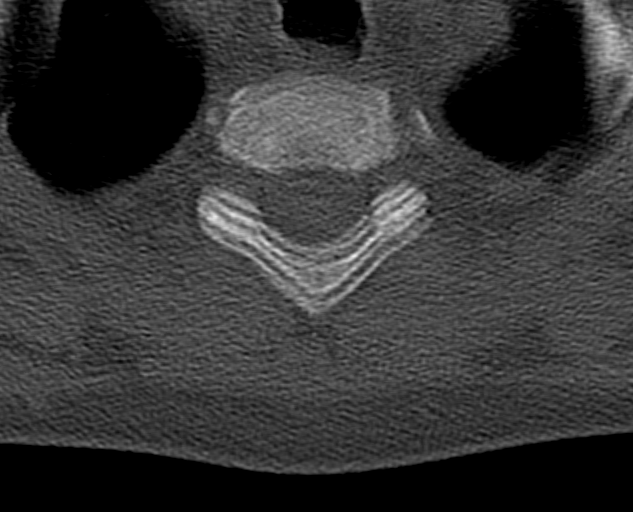
[im 45/111  bone]
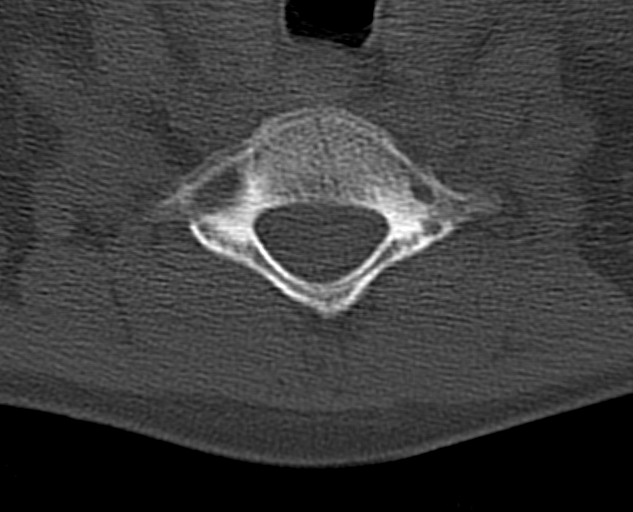
[im 67/111  bone]
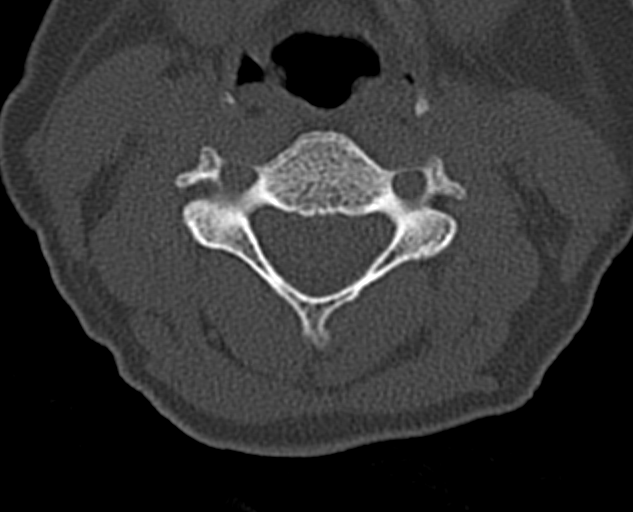
[im 78/111  bone]
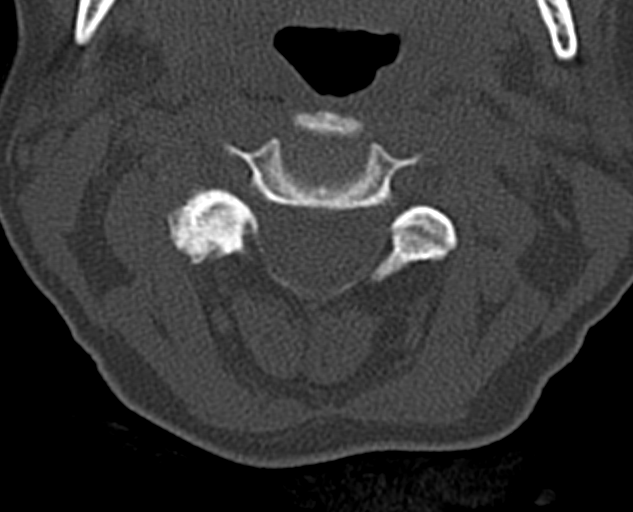
[im 89/111  bone]
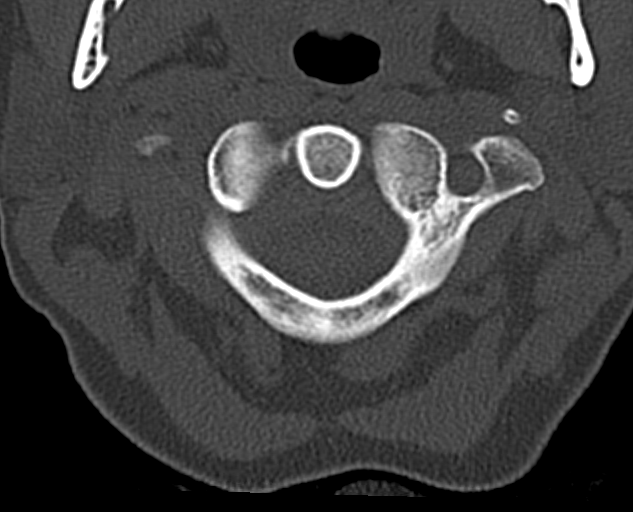
[im 100/111  bone]
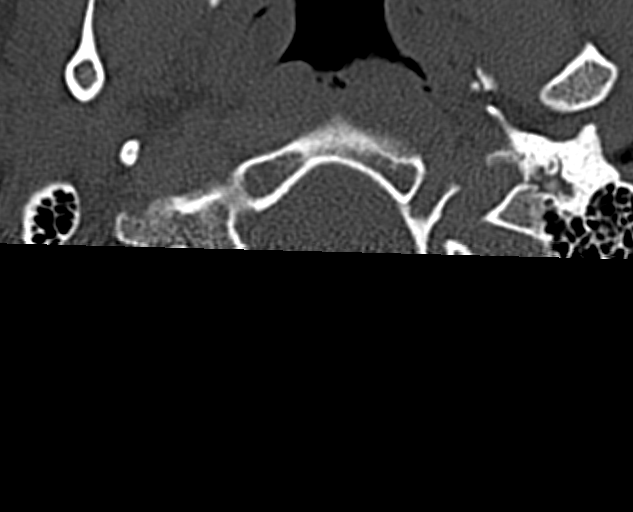

[14 of 47 positions shown; findings below may reference images not displayed]

FINDINGS: CT HEAD FINDINGS

Brain: No evidence of acute infarction, hemorrhage, hydrocephalus,
extra-axial collection or mass lesion/mass effect. There is some
mild atrophy, somewhat greater than expected for the patient's
stated age.

Vascular: No hyperdense vessel or unexpected calcification.

Skull: Normal. Negative for fracture or focal lesion. There is a
laceration in the right periorbital region with some small amount of
subcutaneous gas. There is no radiopaque foreign body. There is no
underlying fracture.

Sinuses/Orbits: No acute finding.

Other: None.

CT CERVICAL SPINE FINDINGS

Alignment: There is reversal of the normal cervical lordotic
curvature.

Skull base and vertebrae: No acute fracture. No primary bone lesion
or focal pathologic process.

Soft tissues and spinal canal: No prevertebral fluid or swelling. No
visible canal hematoma.

Disc levels:  There is mild multilevel disc height loss.

Upper chest: There is a partially visualized airspace opacity at the
right lung apex. This lesion appears cavitary. The lesion measures
approximately 2 cm.

Other: None
IMPRESSION: 1. No acute intracranial abnormality.
2. No acute cervical spine fracture.
3. Right periorbital laceration without an underlying fracture or
radiopaque foreign body.
4. Cavitary lesion in the right upper lobe. Differential
considerations include a cavitary pneumonia, septic emboli, or a
cavitary mass. Follow-up to resolution is recommended. Dedicated
chest imaging may be useful for further evaluation.

## 2020-01-27 IMAGING — US PARACENTESIS WITH ULTRASOUND GUIDANCE
1 series · 3 of 3 positions shown · non-contrast
Comparison: none

INDICATION: Ascites.

[Series 1: paracentesis with ultrasound guidance · 0.25mm/px · 3 of 3 slices shown]
[im 1/3]
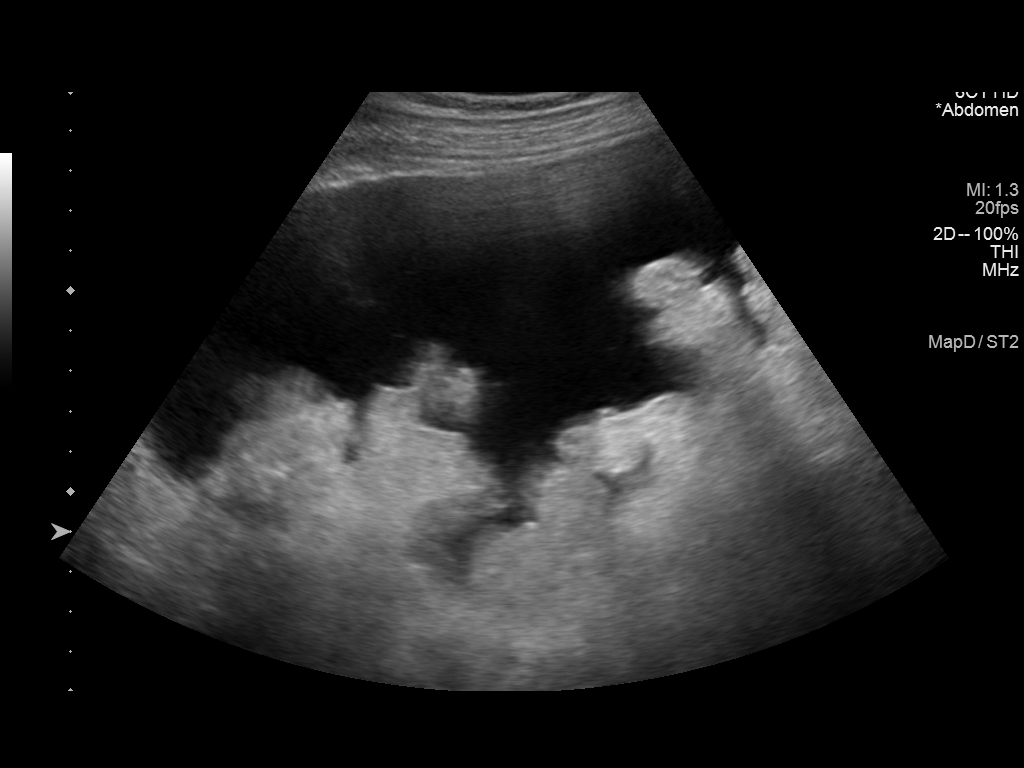
[im 2/3]
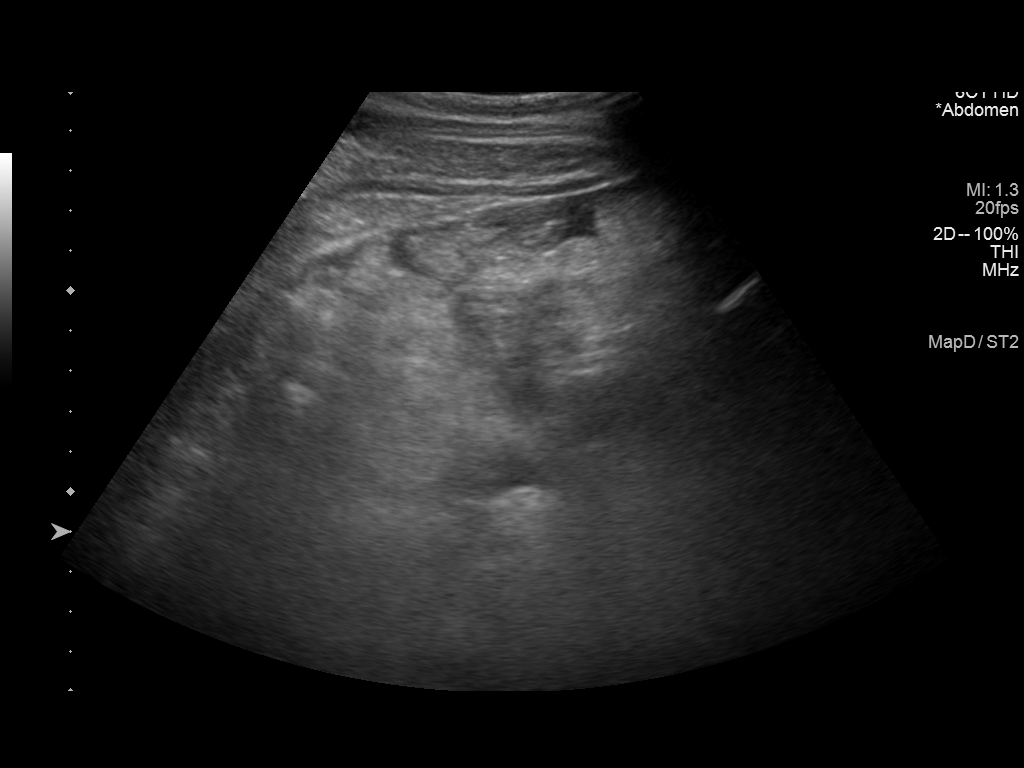
[im 3/3]
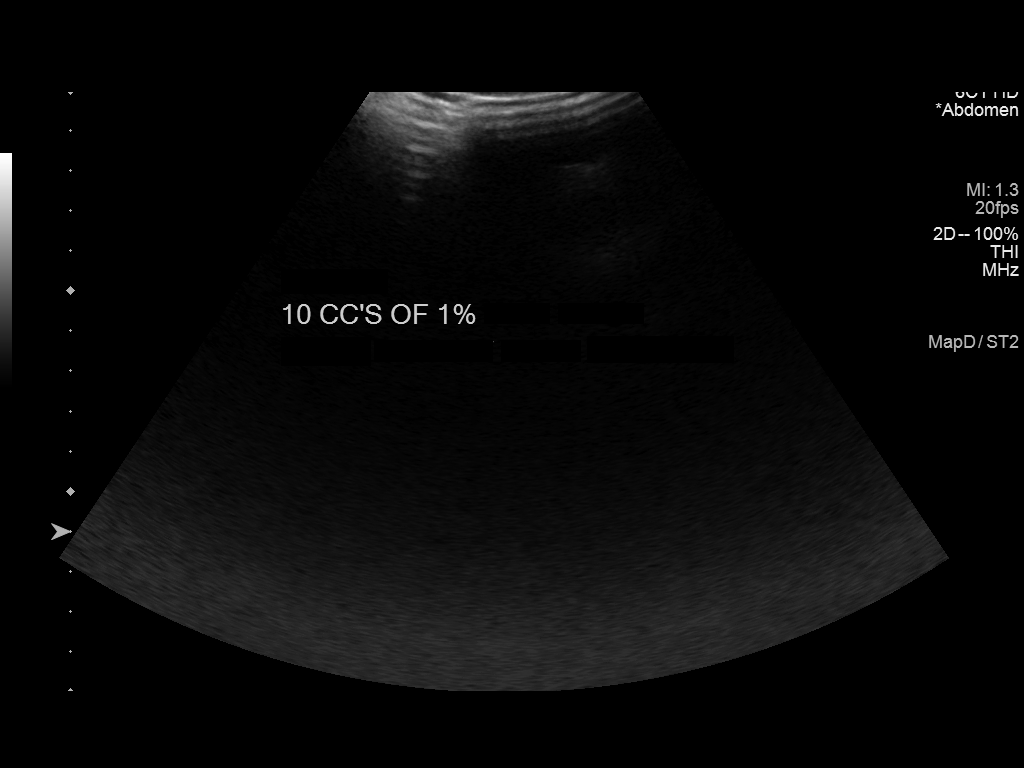

[3 of 3 positions shown; findings below may reference images not displayed]

EXAM:
ULTRASOUND GUIDED  PARACENTESIS

MEDICATIONS:
None.

COMPLICATIONS:
None immediate.

PROCEDURE:
Informed written consent was obtained from the patient after a
discussion of the risks, benefits and alternatives to treatment. A
timeout was performed prior to the initiation of the procedure.

Initial ultrasound scanning demonstrates a large amount of ascites
within the right lower abdominal quadrant. The right lower abdomen
was prepped and draped in the usual sterile fashion. 1% lidocaine
with epinephrine was used for local anesthesia.

Following this, a 8 French catheter was introduced by the
physician's assistant. An ultrasound image was saved for
documentation purposes. The paracentesis was performed. The catheter
was removed and a dressing was applied. The patient tolerated the
procedure well without immediate post procedural complication.
FINDINGS: A total of approximately 3 L of yellow fluid was removed. Samples
were sent to the laboratory as requested by the clinical team.
IMPRESSION: Successful ultrasound-guided paracentesis yielding 3 L liters of
peritoneal fluid.

## 2020-02-05 IMAGING — US PARACENTESIS WITH ULTRASOUND GUIDANCE
1 series · 10 of 10 positions shown · non-contrast
Comparison: none

INDICATION: Recurrent ascites

[Series 1: paracentesis with ultrasound guidance · 10 of 10 slices shown]
[im 1/10]
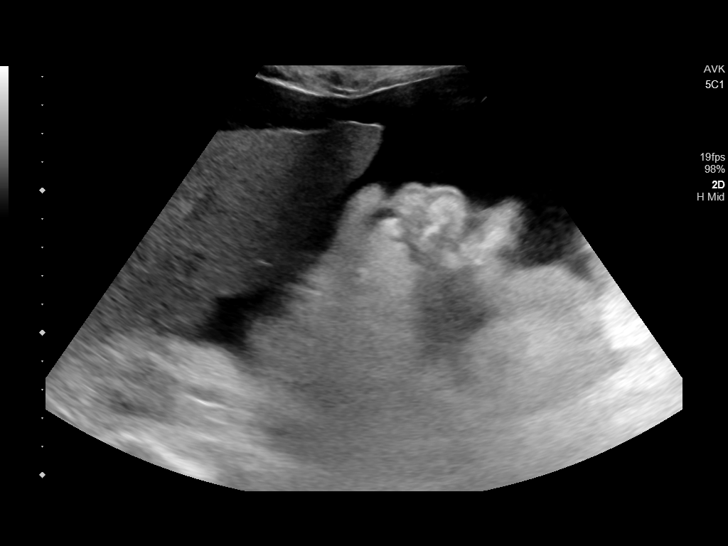
[im 2/10]
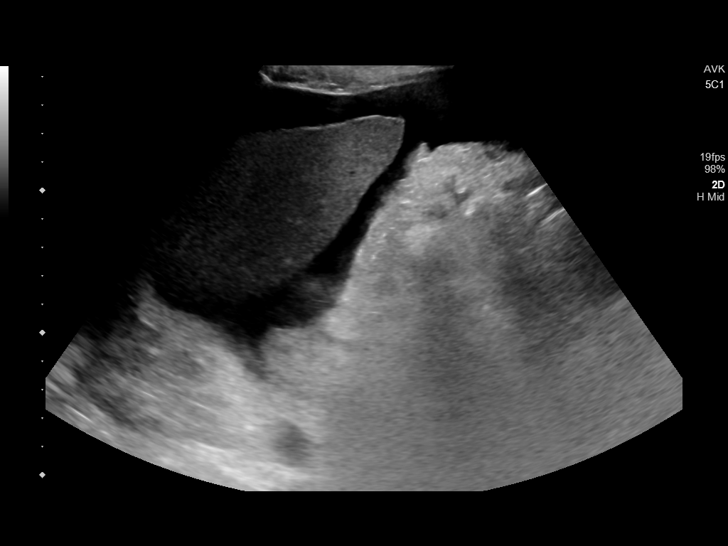
[im 3/10]
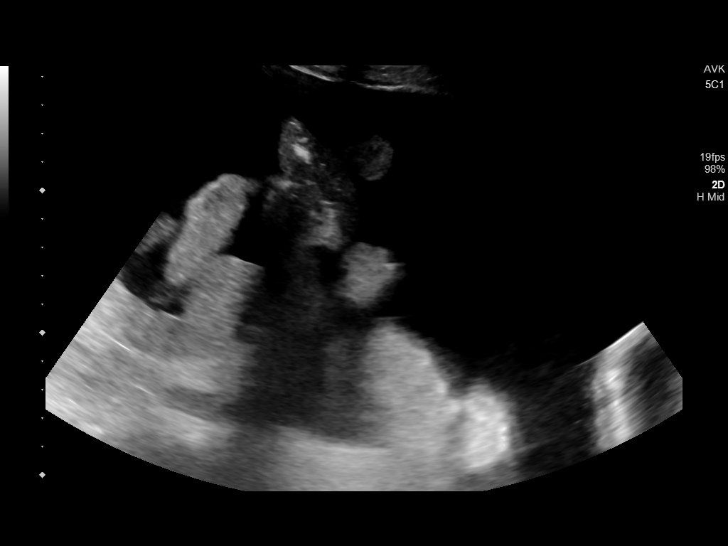
[im 4/10]
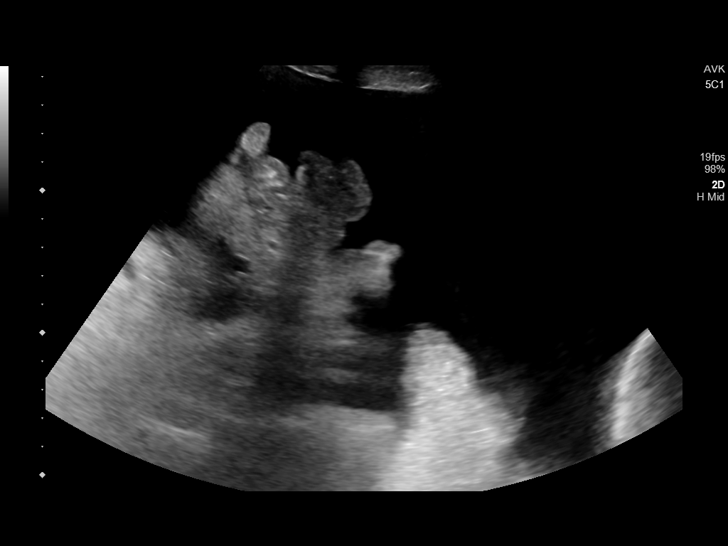
[im 5/10]
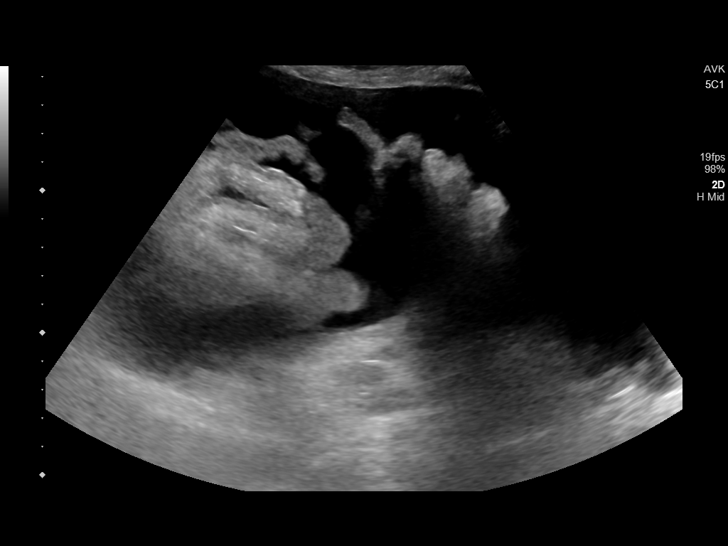
[im 6/10]
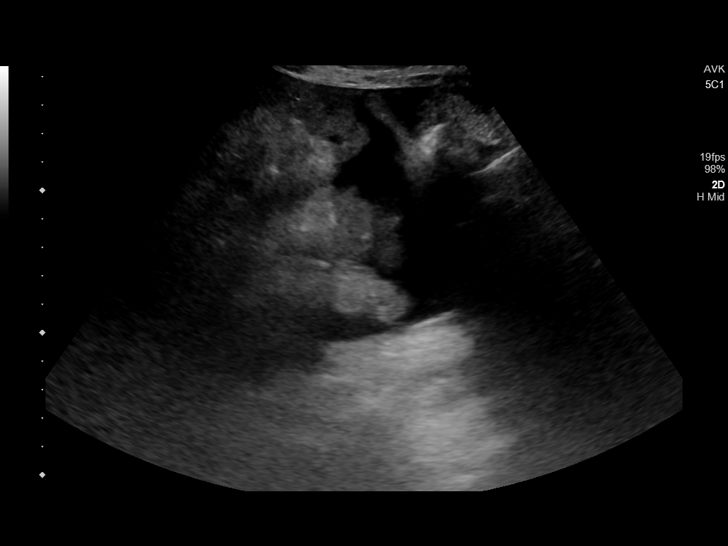
[im 7/10]
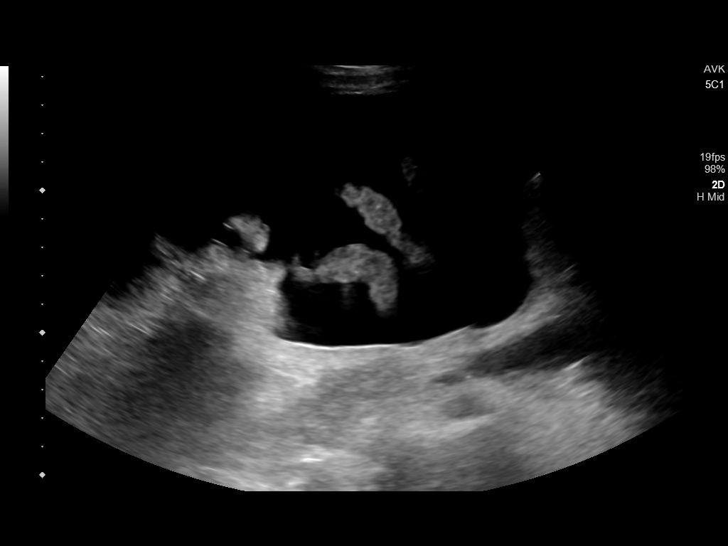
[im 8/10]
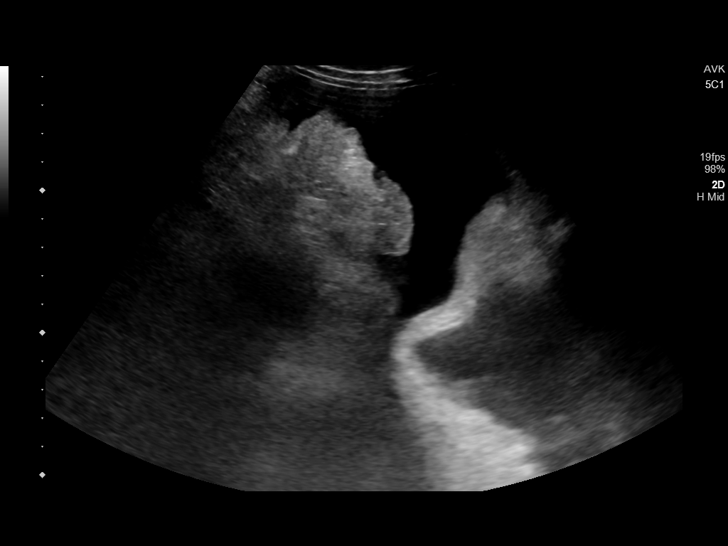
[im 9/10]
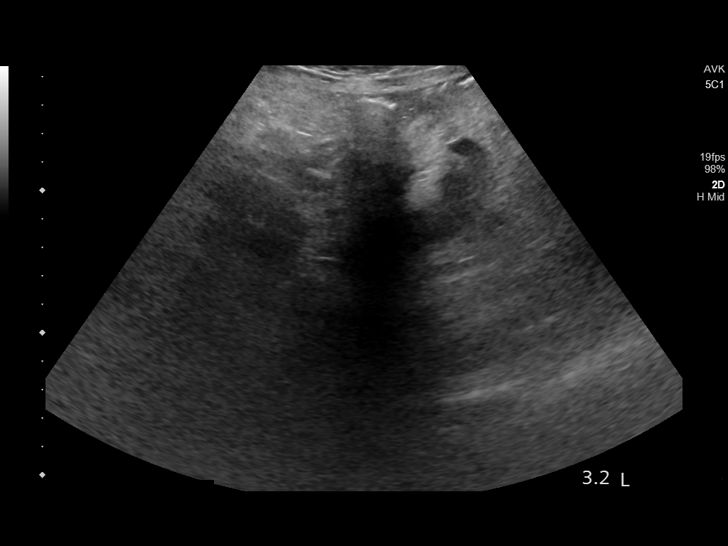
[im 10/10]
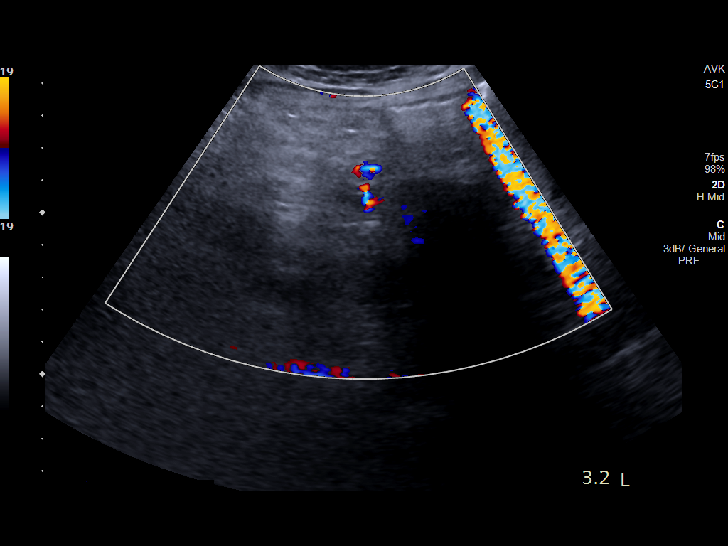

[10 of 10 positions shown; findings below may reference images not displayed]

EXAM:
ULTRASOUND GUIDED PARACENTESIS

MEDICATIONS:
None.

COMPLICATIONS:
None immediate.

PROCEDURE:
Informed written consent was obtained from the patient after a
discussion of the risks, benefits and alternatives to treatment. A
timeout was performed prior to the initiation of the procedure.

Initial ultrasound scanning demonstrates a large amount of ascites
within the right lower abdominal quadrant. The right lower abdomen
was prepped and draped in the usual sterile fashion. 1% lidocaine
was used for local anesthesia.

Following this, a 6 Fr Safe-T-Centesis catheter was introduced. An
ultrasound image was saved for documentation purposes. The
paracentesis was performed. The catheter was removed and a dressing
was applied. The patient tolerated the procedure well without
immediate post procedural complication.
FINDINGS: A total of approximately 3.2 L of clear yellow fluid was removed.
Samples were sent to the laboratory as requested by the clinical
team.
IMPRESSION: Successful ultrasound-guided paracentesis yielding 3.2 liters of
peritoneal fluid.

## 2021-10-31 ENCOUNTER — Inpatient Hospital Stay
Admission: EM | Admit: 2021-10-31 | Discharge: 2021-11-02 | DRG: 377 | Disposition: A | Discharge status: Home / Self Care | Payer: Self-pay | Attending: Internal Medicine | Admitting: Internal Medicine

## 2021-10-31 ENCOUNTER — Other Ambulatory Visit: Payer: Self-pay

## 2021-10-31 DIAGNOSIS — Z23 Encounter for immunization: Secondary | ICD-10-CM

## 2021-10-31 DIAGNOSIS — K269 Duodenal ulcer, unspecified as acute or chronic, without hemorrhage or perforation: Secondary | ICD-10-CM

## 2021-10-31 DIAGNOSIS — D689 Coagulation defect, unspecified: Secondary | ICD-10-CM | POA: Diagnosis present

## 2021-10-31 DIAGNOSIS — R188 Other ascites: Secondary | ICD-10-CM

## 2021-10-31 DIAGNOSIS — I8511 Secondary esophageal varices with bleeding: Secondary | ICD-10-CM | POA: Diagnosis present

## 2021-10-31 DIAGNOSIS — K264 Chronic or unspecified duodenal ulcer with hemorrhage: Principal | ICD-10-CM | POA: Diagnosis present

## 2021-10-31 DIAGNOSIS — B182 Chronic viral hepatitis C: Secondary | ICD-10-CM | POA: Diagnosis present

## 2021-10-31 DIAGNOSIS — D539 Nutritional anemia, unspecified: Secondary | ICD-10-CM | POA: Diagnosis present

## 2021-10-31 DIAGNOSIS — Z8249 Family history of ischemic heart disease and other diseases of the circulatory system: Secondary | ICD-10-CM

## 2021-10-31 DIAGNOSIS — N179 Acute kidney failure, unspecified: Secondary | ICD-10-CM | POA: Diagnosis present

## 2021-10-31 DIAGNOSIS — K920 Hematemesis: Secondary | ICD-10-CM

## 2021-10-31 DIAGNOSIS — R7989 Other specified abnormal findings of blood chemistry: Secondary | ICD-10-CM | POA: Diagnosis present

## 2021-10-31 DIAGNOSIS — Z79899 Other long term (current) drug therapy: Secondary | ICD-10-CM

## 2021-10-31 DIAGNOSIS — R109 Unspecified abdominal pain: Secondary | ICD-10-CM

## 2021-10-31 DIAGNOSIS — I10 Essential (primary) hypertension: Secondary | ICD-10-CM | POA: Diagnosis present

## 2021-10-31 DIAGNOSIS — K766 Portal hypertension: Secondary | ICD-10-CM | POA: Diagnosis present

## 2021-10-31 DIAGNOSIS — Z882 Allergy status to sulfonamides status: Secondary | ICD-10-CM

## 2021-10-31 DIAGNOSIS — K2981 Duodenitis with bleeding: Secondary | ICD-10-CM | POA: Diagnosis present

## 2021-10-31 DIAGNOSIS — Z20822 Contact with and (suspected) exposure to covid-19: Secondary | ICD-10-CM | POA: Diagnosis present

## 2021-10-31 DIAGNOSIS — Z85820 Personal history of malignant melanoma of skin: Secondary | ICD-10-CM

## 2021-10-31 DIAGNOSIS — F17210 Nicotine dependence, cigarettes, uncomplicated: Secondary | ICD-10-CM | POA: Diagnosis present

## 2021-10-31 DIAGNOSIS — K922 Gastrointestinal hemorrhage, unspecified: Secondary | ICD-10-CM | POA: Insufficient documentation

## 2021-10-31 DIAGNOSIS — K3189 Other diseases of stomach and duodenum: Secondary | ICD-10-CM | POA: Diagnosis present

## 2021-10-31 DIAGNOSIS — K7011 Alcoholic hepatitis with ascites: Secondary | ICD-10-CM | POA: Diagnosis present

## 2021-10-31 DIAGNOSIS — K2971 Gastritis, unspecified, with bleeding: Secondary | ICD-10-CM | POA: Diagnosis present

## 2021-10-31 DIAGNOSIS — E8809 Other disorders of plasma-protein metabolism, not elsewhere classified: Secondary | ICD-10-CM | POA: Diagnosis present

## 2021-10-31 DIAGNOSIS — F1020 Alcohol dependence, uncomplicated: Secondary | ICD-10-CM | POA: Diagnosis present

## 2021-10-31 DIAGNOSIS — B1920 Unspecified viral hepatitis C without hepatic coma: Secondary | ICD-10-CM

## 2021-10-31 DIAGNOSIS — K7031 Alcoholic cirrhosis of liver with ascites: Secondary | ICD-10-CM | POA: Diagnosis present

## 2021-10-31 LAB — URINE DRUG SCREEN, QUALITATIVE (ARMC ONLY)
Amphetamines, Ur Screen: NOT DETECTED
Barbiturates, Ur Screen: NOT DETECTED
Benzodiazepine, Ur Scrn: NOT DETECTED
Cannabinoid 50 Ng, Ur ~~LOC~~: POSITIVE — AB
Cocaine Metabolite,Ur ~~LOC~~: NOT DETECTED
MDMA (Ecstasy)Ur Screen: NOT DETECTED
Methadone Scn, Ur: NOT DETECTED
Opiate, Ur Screen: NOT DETECTED
Phencyclidine (PCP) Ur S: NOT DETECTED
Tricyclic, Ur Screen: NOT DETECTED

## 2021-10-31 LAB — CBC
HCT: 36.2 % (ref 36.0–46.0)
Hemoglobin: 12.6 g/dL (ref 12.0–15.0)
MCH: 36 pg — ABNORMAL HIGH (ref 26.0–34.0)
MCHC: 34.8 g/dL (ref 30.0–36.0)
MCV: 103.4 fL — ABNORMAL HIGH (ref 80.0–100.0)
Platelets: 78 10*3/uL — ABNORMAL LOW (ref 150–400)
RBC: 3.5 MIL/uL — ABNORMAL LOW (ref 3.87–5.11)
RDW: 14.6 % (ref 11.5–15.5)
WBC: 8.1 10*3/uL (ref 4.0–10.5)
nRBC: 0 % (ref 0.0–0.2)

## 2021-10-31 LAB — RESP PANEL BY RT-PCR (FLU A&B, COVID) ARPGX2
Influenza A by PCR: NEGATIVE
Influenza B by PCR: NEGATIVE
SARS Coronavirus 2 by RT PCR: NEGATIVE

## 2021-10-31 LAB — COMPREHENSIVE METABOLIC PANEL
ALT: 59 U/L — ABNORMAL HIGH (ref 0–44)
AST: 140 U/L — ABNORMAL HIGH (ref 15–41)
Albumin: 3.8 g/dL (ref 3.5–5.0)
Alkaline Phosphatase: 95 U/L (ref 38–126)
Anion gap: 12 (ref 5–15)
BUN: 16 mg/dL (ref 6–20)
CO2: 25 mmol/L (ref 22–32)
Calcium: 8.9 mg/dL (ref 8.9–10.3)
Chloride: 100 mmol/L (ref 98–111)
Creatinine, Ser: 1.26 mg/dL — ABNORMAL HIGH (ref 0.44–1.00)
GFR, Estimated: 53 mL/min — ABNORMAL LOW (ref >60)
Glucose, Bld: 104 mg/dL — ABNORMAL HIGH (ref 70–99)
Potassium: 4.5 mmol/L (ref 3.5–5.1)
Sodium: 137 mmol/L (ref 135–145)
Total Bilirubin: 0.8 mg/dL (ref 0.3–1.2)
Total Protein: 7.2 g/dL (ref 6.5–8.1)

## 2021-10-31 LAB — PROTIME-INR
INR: 1.4 — ABNORMAL HIGH (ref 0.8–1.2)
Prothrombin Time: 16.9 seconds — ABNORMAL HIGH (ref 11.4–15.2)

## 2021-10-31 LAB — APTT: aPTT: 35 seconds (ref 24–36)

## 2021-10-31 LAB — TYPE AND SCREEN
ABO/RH(D): O POS
Antibody Screen: NEGATIVE

## 2021-10-31 LAB — POC URINE PREG, ED: Preg Test, Ur: NEGATIVE

## 2021-10-31 MED ORDER — LORAZEPAM 1 MG PO TABS: 1.0000 mg | ORAL_TABLET | ORAL | Status: DC | PRN

## 2021-10-31 MED ORDER — LORAZEPAM 2 MG/ML IJ SOLN: 1.0000 mg | INTRAMUSCULAR | Status: DC | PRN

## 2021-10-31 MED ORDER — LORAZEPAM 2 MG/ML IJ SOLN: 0.0000 mg | Freq: Two times a day (BID) | INTRAMUSCULAR | Status: DC

## 2021-10-31 MED ORDER — ONDANSETRON HCL 4 MG/2ML IJ SOLN: 4.0000 mg | Freq: Four times a day (QID) | INTRAMUSCULAR | Status: DC | PRN

## 2021-10-31 MED ORDER — THIAMINE HCL 100 MG/ML IJ SOLN
100.0000 mg | Freq: Every day | INTRAMUSCULAR | Status: DC
  Filled 2021-10-31: qty 2

## 2021-10-31 MED ORDER — SODIUM CHLORIDE 0.9 % IV SOLN
1.0000 g | Freq: Once | INTRAVENOUS | Status: AC
  Administered 2021-10-31: 1 g via INTRAVENOUS
  Filled 2021-10-31: qty 10

## 2021-10-31 MED ORDER — OCTREOTIDE LOAD VIA INFUSION
50.0000 ug | Freq: Once | INTRAVENOUS | Status: AC
  Administered 2021-10-31: 50 ug via INTRAVENOUS
  Filled 2021-10-31: qty 25

## 2021-10-31 MED ORDER — THIAMINE HCL 100 MG PO TABS
100.0000 mg | ORAL_TABLET | Freq: Every day | ORAL | Status: DC
  Administered 2021-11-02: 100 mg via ORAL
  Filled 2021-10-31 (×2): qty 1

## 2021-10-31 MED ORDER — ADULT MULTIVITAMIN W/MINERALS CH
1.0000 | ORAL_TABLET | Freq: Every day | ORAL | Status: DC
  Administered 2021-11-02: 1 via ORAL
  Filled 2021-10-31 (×2): qty 1

## 2021-10-31 MED ORDER — ACETAMINOPHEN 650 MG RE SUPP: 650.0000 mg | Freq: Four times a day (QID) | RECTAL | Status: DC | PRN

## 2021-10-31 MED ORDER — PANTOPRAZOLE INFUSION (NEW) - SIMPLE MED
8.0000 mg/h | INTRAVENOUS | Status: DC
  Administered 2021-10-31 – 2021-11-01 (×2): 8 mg/h via INTRAVENOUS
  Filled 2021-10-31 (×2): qty 100

## 2021-10-31 MED ORDER — SODIUM CHLORIDE 0.9 % IV SOLN
1.0000 g | INTRAVENOUS | Status: DC
  Administered 2021-11-01: 1 g via INTRAVENOUS
  Filled 2021-10-31 (×2): qty 10

## 2021-10-31 MED ORDER — LORAZEPAM 2 MG/ML IJ SOLN: 0.0000 mg | Freq: Four times a day (QID) | INTRAMUSCULAR | Status: DC

## 2021-10-31 MED ORDER — MORPHINE SULFATE (PF) 2 MG/ML IV SOLN
2.0000 mg | INTRAVENOUS | Status: DC | PRN
  Administered 2021-11-01: 2 mg via INTRAVENOUS
  Filled 2021-10-31: qty 1

## 2021-10-31 MED ORDER — ONDANSETRON HCL 4 MG PO TABS: 4.0000 mg | ORAL_TABLET | Freq: Four times a day (QID) | ORAL | Status: DC | PRN

## 2021-10-31 MED ORDER — FENTANYL CITRATE PF 50 MCG/ML IJ SOSY
50.0000 ug | PREFILLED_SYRINGE | Freq: Once | INTRAMUSCULAR | Status: AC
  Administered 2021-10-31: 50 ug via INTRAVENOUS
  Filled 2021-10-31: qty 1

## 2021-10-31 MED ORDER — PANTOPRAZOLE SODIUM 40 MG IV SOLR: 40.0000 mg | Freq: Two times a day (BID) | INTRAVENOUS | Status: DC

## 2021-10-31 MED ORDER — METOCLOPRAMIDE HCL 5 MG/ML IJ SOLN
10.0000 mg | Freq: Four times a day (QID) | INTRAMUSCULAR | Status: AC
  Administered 2021-10-31 – 2021-11-01 (×2): 10 mg via INTRAVENOUS
  Filled 2021-10-31 (×2): qty 2

## 2021-10-31 MED ORDER — LACTATED RINGERS IV SOLN: INTRAVENOUS | Status: DC

## 2021-10-31 MED ORDER — FOLIC ACID 1 MG PO TABS
1.0000 mg | ORAL_TABLET | Freq: Every day | ORAL | Status: DC
  Administered 2021-11-02: 1 mg via ORAL
  Filled 2021-10-31 (×2): qty 1

## 2021-10-31 MED ORDER — PANTOPRAZOLE 80MG IVPB - SIMPLE MED
80.0000 mg | Freq: Once | INTRAVENOUS | Status: AC
  Administered 2021-10-31: 80 mg via INTRAVENOUS
  Filled 2021-10-31 (×2): qty 100

## 2021-10-31 MED ORDER — ACETAMINOPHEN 325 MG PO TABS: 650.0000 mg | ORAL_TABLET | Freq: Four times a day (QID) | ORAL | Status: DC | PRN

## 2021-10-31 MED ORDER — SODIUM CHLORIDE 0.9 % IV SOLN
50.0000 ug/h | INTRAVENOUS | Status: DC
  Administered 2021-10-31 – 2021-11-02 (×3): 50 ug/h via INTRAVENOUS
  Filled 2021-10-31: qty 5
  Filled 2021-10-31 (×3): qty 1
  Filled 2021-10-31: qty 5
  Filled 2021-10-31 (×2): qty 1

## 2021-10-31 NOTE — Assessment & Plan Note (Addendum)
Patient states her abdominal pain was from her recent abdominal hernia surgery. ?

## 2021-10-31 NOTE — Care Plan (Addendum)
Brief GI Care Plan Note ?Contacted by hospitalist service on patient c h/o alcoholic and HCV cirrhosis with ascites (followed by [facility]) who presents tonight after 2 days of vomiting which is said to have included blood. Has noted blood in stool along with some abdominal discomfort. Previous stomach ulcer with similar symptoms. ?Pt has actively been drinking alcohol and current transaminases reflect this (2:1 ast alt ratio). ?No current endoscopy records available [facility] records not yet available on care everywhere- have been requested) ? ?Pt's hgb is 12.6 (above her baseline in available records) and macrocytic. Plt at 78K and INR 1.4. BUN CR ratio is low 16/1.26. FOBT negative. Vital signs are currently stable at 114/77 and hr 84. No vomiting since arrival. ?Pt requested transfer to [facility], however, they are not currently accepting transfers at this time and patient is to be admitted at [facility] to hospitalist serivce. ? ?MELD-Na 14; Mdf 18.7 ? ?Plan: ?Formal GI Consult note to follow ?Esophagogastroduodenoscopy planned for tomorrow pending patient stability ?NPO now. ?Monitor for decompensation and notify GI if this occurs and can adjust endoscopy plans as indicated ?Labs in am- cmp, cbc, inr ?Protonix 40 mg iv q12 h ?Hold dvt ppx ?Octreotide bolus and gtt for 72 hours total ?Ceftriaxone 1 g iv q24 for 7 days total for sbp ppx ?Type and screen ?Monitor H&H.  Transfusion and resuscitation as per primary team ?Avoid frequent lab draws to prevent lab induced anemia ?Supportive care, IVF and antiemetics as per primary team ?Maintain two sites IV access ?Avoid nsaids ?Monitor for GIB. ?Will request records from [facility] as pt apparently follows with their GI service ?Order peth and urine drug screen ?Order evaluation for paracentesis with studies as indicated ?This was discussed with the hospitalist service ? ?[name], DO ?[facility] ? ? ?

## 2021-10-31 NOTE — Assessment & Plan Note (Addendum)
Patient declined paracentesis on this hospital stay.  Can go back on Lasix and Aldactone.  Patient takes Xifaxan also at home. ?

## 2021-10-31 NOTE — H&P (Signed)
?History and Physical  ? ? ?Patient: Abigail Burton NOM:[id number] DOB: 12-26-1975 ?DOA: 10/31/2021 ?DOS: the patient was seen and examined on 10/31/2021 ?PCP: Moss Mc, MD  ?Patient coming from: Home ? ?Chief Complaint:  ?Chief Complaint  ?Patient presents with  ? Hematemesis  ? ? ?HPI: Abigail Burton is a 46 y.o. female with medical history significant for Alcohol use disorder, alcoholic cirrhosis with ascites, chronic hepatitis C, history of melanoma s/p resection in 2000, peptic ulcer, hospitalized in 2021 with GI bleed, treated conservatively, not requiring transfusion, and with sterile ascitic fluid on paracentesis who presents to the ED with a report of vomiting blood and dark blood per rectum x2 days associated with abdominal distention and pain and generalized malaise.  Patient admits to continued alcohol use with last use 2 hours prior to arrival in the ED. ?ED course: On arrival vitals within normal limits.  Blood work significant for hemoglobin of 12.6, platelets 76,000.  Creatinine 1.26, AST/ALT 140/59, INR 1.4. ?Patient started on octreotide infusion, Protonix bolus and infusion, treated with IV pain medicine and antiemetic.  Hospitalist consulted for admission  ? ?Review of Systems: As mentioned in the history of present illness. All other systems reviewed and are negative. ?Past Medical History:  ?Diagnosis Date  ? Alcohol dependence ([place])   ? Asthma   ? Cancer [facility])   ? left shoulder melonoma with excision  ? DDD (degenerative disc disease), lumbosacral   ? Hepatitis C   ? Hypertension   ? Renal disorder   ? ?Past Surgical History:  ?Procedure Laterality Date  ? cancer removal    ? ?Social History:  reports that she has been smoking cigarettes. She has been smoking an average of .5 packs per day. She has never used smokeless tobacco. She reports that she does not currently use alcohol. She reports that she does not use drugs. ? ?Allergies  ?Allergen Reactions  ? Sulfa Antibiotics Other (See  Comments)  ?  Reaction: unknown  ? ? ?Family History  ?Problem Relation Age of Onset  ? Hypertension Other   ? ? ?Prior to Admission medications   ?Medication Sig Start Date End Date Taking? Authorizing Provider  ?buPROPion (WELLBUTRIN XL) 300 MG 24 hr tablet Take 300 mg by mouth daily. 05/31/18  Yes [provider]  ?furosemide (LASIX) 40 MG tablet Take 20 mg by mouth daily. 04/22/19  Yes [provider]  ?pantoprazole (PROTONIX) 40 MG tablet Take 1 tablet (40 mg total) by mouth daily. 04/19/19 10/31/21 Yes Ojie, Jude, MD  ?rifaximin (XIFAXAN) 550 MG TABS tablet Take 1 tablet (550 mg total) by mouth 2 (two) times daily. 04/19/19  Yes Ojie, Jude, MD  ?gabapentin (NEURONTIN) 300 MG capsule Take 900 mg by mouth 3 (three) times daily.  ?Patient not taking: Reported on 10/31/2021 02/23/17   [provider]  ?lactulose ([place]) 10 GM/15ML solution Take by mouth. ?Patient not taking: Reported on 10/31/2021 03/26/19   [provider]  ?sertraline (ZOLOFT) 50 MG tablet Take 50 mg by mouth 2 (two) times a day.  05/31/18   [provider]  ?spironolactone (ALDACTONE) 100 MG tablet Take by mouth. 04/22/19   [provider]  ?trazodone (DESYREL) 300 MG tablet Take 300 mg by mouth at bedtime as needed for sleep.    [provider]  ? ? ?Physical Exam: ?Vitals:  ? 10/31/21 1925 10/31/21 2100 10/31/21 2130  ?BP: 123/81 110/76 114/77  ?Pulse: 78 78 84  ?Resp: '16 16 17  '$ ?Temp: 98.7 ?F (  37.1 ?C)    ?TempSrc: Oral    ?SpO2: 92% 96% 100%  ?Weight: 54.4 kg    ?Height: '5\' 1"'$  (1.549 m)    ? ?Physical Exam ?Vitals and nursing note reviewed.  ?Constitutional:   ?   General: She is not in acute distress. ?   Appearance: Normal appearance.  ?HENT:  ?   Head: Normocephalic and atraumatic.  ?Cardiovascular:  ?   Rate and Rhythm: Normal rate and regular rhythm.  ?   Pulses: Normal pulses.  ?   Heart sounds: Normal heart sounds. No murmur heard. ?Pulmonary:  ?   Effort: Pulmonary effort is  normal.  ?   Breath sounds: Normal breath sounds. No wheezing or rhonchi.  ?Abdominal:  ?   General: Bowel sounds are normal.  ?   Palpations: Abdomen is soft.  ?   Tenderness: There is abdominal tenderness.  ?   Comments: Mild diffuse tenderness  ?Musculoskeletal:     ?   General: No swelling or tenderness. Normal range of motion.  ?   Cervical back: Normal range of motion and neck supple.  ?Skin: ?   General: Skin is warm and dry.  ?Neurological:  ?   General: No focal deficit present.  ?   Mental Status: She is alert. Mental status is at baseline.  ?Psychiatric:     ?   Mood and Affect: Mood normal.     ?   Behavior: Behavior normal.  ? ? ? ?Data Reviewed: ?Relevant notes from primary care and specialist visits, past discharge summaries as available in EHR, including Care Everywhere. ?Prior diagnostic testing as pertinent to current admission diagnoses ?Updated medications and problem lists for reconciliation ?ED course, including vitals, labs, imaging, treatment and response to treatment ?Triage notes, nursing and pharmacy notes and ED provider's notes ?Notable results as noted in HPI ? ? ?Assessment and Plan: ?* Hematemesis ?Discussed with Dr. Virgina Jock with following recommendations: ?N.p.o. with plans for EGD in a.m. ?Monitor for decompensation and notify GI if this occurs and can adjust endoscopy plans as indicated ?Labs in am- cmp, cbc, inr ?Protonix 40 mg iv q12 h ?Hold dvt ppx ?Octreotide bolus and gtt for 72 hours total ?Ceftriaxone 1 g iv q24 for 7 days total for sbp ppx ?Type and screen ?Monitor H&H.  Transfusion and resuscitation as per primary team ?Avoid frequent lab draws to prevent lab induced anemia ?Supportive care, IVF and antiemetics as per primary team ?Maintain two sites IV access ?Avoid nsaids ?Monitor for GIB. ?Will request records from [facility] as pt apparently follows with their GI service ?Order peth and urine drug screen ?Order evaluation for paracentesis with studies as indicated ?This was  discussed with the hospitalist service ? ?Abdominal pain ?IV Rocephin ?Pain management ?Follow ascitic fluid studies ? ?Alcoholic cirrhosis of liver with ascites ([place]) ?IR consulted for paracentesis ? ?HTN (hypertension) ?Hydralazine IV as needed while n.p.o. ? ?Alcohol use disorder, moderate, dependence ([place]) ?CIWA withdrawal protocol ? ? ? ? ? ? ?Advance Care Planning:   Code Status: Prior  ? ?Consults: GI, Dr Virgina Jock ? ?Family Communication: mother at bedside ? ?Severity of Illness: ?The appropriate patient status for this patient is INPATIENT. Inpatient status is judged to be reasonable and necessary in order to provide the required intensity of service to ensure the patient's safety. The patient's presenting symptoms, physical exam findings, and initial radiographic and laboratory data in the context of their chronic comorbidities is felt to place them at high risk for further  clinical deterioration. Furthermore, it is not anticipated that the patient will be medically stable for discharge from the hospital within 2 midnights of admission.  ? ?* I certify that at the point of admission it is my clinical judgment that the patient will require inpatient hospital care spanning beyond 2 midnights from the point of admission due to high intensity of service, high risk for further deterioration and high frequency of surveillance required.* ? ?Author: ?Athena Masse, MD ?10/31/2021 11:02 PM ? ?For on call review [email or web address].  ?

## 2021-10-31 NOTE — Assessment & Plan Note (Addendum)
Continue spironolactone.  Can restart Lasix as outpatient ?

## 2021-10-31 NOTE — ED Notes (Signed)
Called [facility] for possible transfer spoke W/[place] ?

## 2021-10-31 NOTE — Assessment & Plan Note (Addendum)
No signs of alcohol withdrawal this morning.  The patient must stop alcohol if she is going to be a candidate for liver transplant. ?

## 2021-10-31 NOTE — Assessment & Plan Note (Addendum)
EGD shows a shallow duodenal ulcer, portal hypertensive gastropathy and grade 2 esophageal varices.  Case discussed with gastroenterology and recommend 72 hours of octreotide drip and patient also on Protonix drip.  Empiric Rocephin while in the hospital and p.o. Cipro upon discharge.  Last hemoglobin 11.8.  Tolerated full liquid diet okay to go home on soft diet as per GI. ?

## 2021-10-31 NOTE — ED Provider Notes (Signed)
? ?[facility] ?Provider Note ? ? ? Event Date/Time  ? First MD Initiated Contact with Patient 10/31/21 1935   ?  (approximate) ? ? ?History  ? ?Hematemesis ? ? ?HPI ? ?Abigail Burton is a 46 y.o. female  who, per outside hospital note dated 05/01/19 has history of liver cirrhosis, hep C, alcohol use, who presents to the emergency department today because of concern for bloody vomit and stool. First noticed the symptoms two days ago. Describes it as red blood in both vomit and stool. This has been accompanied by some abdominal pain. She also has felt tired and dizzy. States she has vomited blood in the past and was diagnosed with a stomach ulcer. Patient states she follows at [facility].  ? ? ?Physical Exam  ? ?Triage Vital Signs: ?ED Triage Vitals [10/31/21 1925]  ?Enc Vitals Group  ?   BP 123/81  ?   Pulse Rate 78  ?   Resp 16  ?   Temp 98.7 ?F (37.1 ?C)  ?   Temp Source Oral  ?   SpO2 92 %  ?   Weight 120 lb (54.4 kg)  ?   Height '5\' 1"'$  (1.549 m)  ?   Head Circumference   ?   Peak Flow   ?   Pain Score 6  ?   Pain Loc   ?   Pain Edu?   ?   Excl. in [place]?   ? ? ?Most recent vital signs: ?Vitals:  ? 10/31/21 1925  ?BP: 123/81  ?Pulse: 78  ?Resp: 16  ?Temp: 98.7 ?F (37.1 ?C)  ?SpO2: 92%  ? ?General: Awake, no distress.  ?CV:  Good peripheral perfusion. Regular rate and rhythm ?Resp:  Normal effort. Clear to ausculation ?Abd:  No distention. Well healed periumbilical surgical scar. Non tender.  ?Eyes:  No scleral icterus.  ? ? ?ED Results / Procedures / Treatments  ? ?Labs ?(all labs ordered are listed, but only abnormal results are displayed) ?Labs Reviewed  ?COMPREHENSIVE METABOLIC PANEL - Abnormal; Notable for the following components:  ?    Result Value  ? Glucose, Bld 104 (*)   ? Creatinine, Ser 1.26 (*)   ? AST 140 (*)   ? ALT 59 (*)   ? GFR, Estimated 53 (*)   ? All other components within normal limits  ?CBC - Abnormal; Notable for the following components:  ? RBC 3.50 (*)   ? MCV 103.4 (*)   ? MCH  36.0 (*)   ? Platelets 78 (*)   ? All other components within normal limits  ?PROTIME-INR - Abnormal; Notable for the following components:  ? Prothrombin Time 16.9 (*)   ? INR 1.4 (*)   ? All other components within normal limits  ?RESP PANEL BY RT-PCR (FLU A&B, COVID) ARPGX2  ?APTT  ?POC OCCULT BLOOD, ED  ?POC URINE PREG, ED  ?TYPE AND SCREEN  ?TYPE AND SCREEN  ?TYPE AND SCREEN  ? ? ? ?EKG ? ?None ? ? ?RADIOLOGY ?None ? ? ?PROCEDURES: ? ?Critical Care performed: Yes, see critical care procedure note(s) ? ?Procedures ? ?CRITICAL CARE ?Performed by: Nance Pear ? ? ?Total critical care time: 35 minutes ? ?Critical care time was exclusive of separately billable procedures and treating other patients. ? ?Critical care was necessary to treat or prevent imminent or life-threatening deterioration. ? ?Critical care was time spent personally by me on the following activities: development of treatment plan with patient and/or surrogate as  well as nursing, discussions with consultants, evaluation of patient's response to treatment, examination of patient, obtaining history from patient or surrogate, ordering and performing treatments and interventions, ordering and review of laboratory studies, ordering and review of radiographic studies, pulse oximetry and re-evaluation of patient's condition. ? ? ? ?MEDICATIONS ORDERED IN ED: ?Medications - No data to display ? ? ?IMPRESSION / MDM / ASSESSMENT AND PLAN / ED COURSE  ?I reviewed the triage vital signs and the nursing notes. ?             ?               ? ?Differential diagnosis includes, but is not limited to, variceal bleed, ulcer bleed, lower gi bleed. ? ?Patient with history of cirrhosis who presents to the emergency department today because of concerns for bright red emesis in bloody stool.  Patient's initial full vital signs without any concerning findings.  However given history of cirrhosis did have concern for possible variceal bleed.  Given bright red  hematemesis will plan on treating for potential variceal bleed.  Patient was ordered octreotide, Protonix and ceftriaxone.  Fortunately at this time the patient's hemoglobin in the serum is within normal limits.  Platelet count was slightly low.  Guaiac test was negative however did not get any visible stool on glove after rectal exam.  Patient did request transfer to [facility] given that is where she gets her care.  [facility] was contacted however they did are currently close to medical transfers.  I discussed this with the patient and at this time she is amenable to be admitted to our facility.  ? ? ?FINAL CLINICAL IMPRESSION(S) / ED DIAGNOSES  ? ?Final diagnoses:  ?Gastrointestinal hemorrhage, unspecified gastrointestinal hemorrhage type  ? ? ? ?Note:  This document was prepared using Dragon voice recognition software and may include unintentional dictation errors. ? ?  ?Nance Pear, MD ?10/31/21 2232 ? ?

## 2021-10-31 NOTE — ED Triage Notes (Signed)
Pt states has alcoholic cirrhosis and has been vomiting blood and having lower gi bleeding for two days. Pt states she feels like her abd is swollen and "I don't feel good at all". Pt states last etoh 2 hours pta.  ?

## 2021-11-01 ENCOUNTER — Inpatient Hospital Stay: Payer: Medicaid Other | Admitting: Anesthesiology

## 2021-11-01 ENCOUNTER — Encounter
Admission: EM | Disposition: A | Discharge status: Home / Self Care | Payer: Self-pay | Source: Home / Self Care | Attending: Internal Medicine

## 2021-11-01 ENCOUNTER — Encounter: Payer: Self-pay | Admitting: Internal Medicine

## 2021-11-01 DIAGNOSIS — I10 Essential (primary) hypertension: Secondary | ICD-10-CM

## 2021-11-01 DIAGNOSIS — N179 Acute kidney failure, unspecified: Secondary | ICD-10-CM

## 2021-11-01 DIAGNOSIS — R7989 Other specified abnormal findings of blood chemistry: Secondary | ICD-10-CM

## 2021-11-01 DIAGNOSIS — K922 Gastrointestinal hemorrhage, unspecified: Secondary | ICD-10-CM | POA: Diagnosis present

## 2021-11-01 DIAGNOSIS — K7031 Alcoholic cirrhosis of liver with ascites: Secondary | ICD-10-CM

## 2021-11-01 DIAGNOSIS — K269 Duodenal ulcer, unspecified as acute or chronic, without hemorrhage or perforation: Secondary | ICD-10-CM

## 2021-11-01 HISTORY — PX: ESOPHAGOGASTRODUODENOSCOPY (EGD) WITH PROPOFOL: SHX5813

## 2021-11-01 LAB — HEMOGLOBIN AND HEMATOCRIT, BLOOD
HCT: 35.1 % — ABNORMAL LOW (ref 36.0–46.0)
HCT: 33.5 % — ABNORMAL LOW (ref 36.0–46.0)
Hemoglobin: 11.8 g/dL — ABNORMAL LOW (ref 12.0–15.0)
Hemoglobin: 11.3 g/dL — ABNORMAL LOW (ref 12.0–15.0)

## 2021-11-01 LAB — COMPREHENSIVE METABOLIC PANEL
ALT: 49 U/L — ABNORMAL HIGH (ref 0–44)
AST: 91 U/L — ABNORMAL HIGH (ref 15–41)
Albumin: 3.3 g/dL — ABNORMAL LOW (ref 3.5–5.0)
Alkaline Phosphatase: 75 U/L (ref 38–126)
Anion gap: 10 (ref 5–15)
BUN: 14 mg/dL (ref 6–20)
CO2: 25 mmol/L (ref 22–32)
Calcium: 8.1 mg/dL — ABNORMAL LOW (ref 8.9–10.3)
Chloride: 105 mmol/L (ref 98–111)
Creatinine, Ser: 0.85 mg/dL (ref 0.44–1.00)
GFR, Estimated: >60 mL/min (ref >60)
Glucose, Bld: 81 mg/dL (ref 70–99)
Potassium: 4.4 mmol/L (ref 3.5–5.1)
Sodium: 140 mmol/L (ref 135–145)
Total Bilirubin: 1 mg/dL (ref 0.3–1.2)
Total Protein: 6.2 g/dL — ABNORMAL LOW (ref 6.5–8.1)

## 2021-11-01 LAB — HIV ANTIBODY (ROUTINE TESTING W REFLEX): HIV Screen 4th Generation wRfx: NONREACTIVE

## 2021-11-01 LAB — PROTIME-INR
INR: 1.4 — ABNORMAL HIGH (ref 0.8–1.2)
Prothrombin Time: 17.1 seconds — ABNORMAL HIGH (ref 11.4–15.2)

## 2021-11-01 LAB — MRSA NEXT GEN BY PCR, NASAL: MRSA by PCR Next Gen: NOT DETECTED

## 2021-11-01 SURGERY — ESOPHAGOGASTRODUODENOSCOPY (EGD) WITH PROPOFOL
Anesthesia: General

## 2021-11-01 MED ORDER — SODIUM CHLORIDE 0.9 % IV SOLN: INTRAVENOUS | Status: DC

## 2021-11-01 MED ORDER — MIDAZOLAM HCL 2 MG/2ML IJ SOLN
INTRAMUSCULAR | Status: DC | PRN
  Administered 2021-11-01: 2 mg via INTRAVENOUS

## 2021-11-01 MED ORDER — BUPROPION HCL ER (XL) 150 MG PO TB24
300.0000 mg | ORAL_TABLET | Freq: Every day | ORAL | Status: DC
  Administered 2021-11-02: 300 mg via ORAL
  Filled 2021-11-01 (×2): qty 2

## 2021-11-01 MED ORDER — RIFAXIMIN 550 MG PO TABS
550.0000 mg | ORAL_TABLET | Freq: Two times a day (BID) | ORAL | Status: DC
  Administered 2021-11-01 – 2021-11-02 (×2): 550 mg via ORAL
  Filled 2021-11-01 (×4): qty 1

## 2021-11-01 MED ORDER — CHLORHEXIDINE GLUCONATE 0.12 % MT SOLN
15.0000 mL | Freq: Two times a day (BID) | OROMUCOSAL | Status: DC
  Administered 2021-11-01: 15 mL via OROMUCOSAL
  Filled 2021-11-01 (×2): qty 15

## 2021-11-01 MED ORDER — SERTRALINE HCL 50 MG PO TABS
50.0000 mg | ORAL_TABLET | Freq: Every day | ORAL | Status: DC
  Administered 2021-11-02: 50 mg via ORAL
  Filled 2021-11-01 (×2): qty 1

## 2021-11-01 MED ORDER — SPIRONOLACTONE 25 MG PO TABS
100.0000 mg | ORAL_TABLET | Freq: Every day | ORAL | Status: DC
  Administered 2021-11-01 – 2021-11-02 (×2): 100 mg via ORAL
  Filled 2021-11-01 (×2): qty 4

## 2021-11-01 MED ORDER — ORAL CARE MOUTH RINSE
15.0000 mL | Freq: Two times a day (BID) | OROMUCOSAL | Status: DC
  Filled 2021-11-01 (×2): qty 15

## 2021-11-01 MED ORDER — TRAZODONE HCL 50 MG PO TABS
50.0000 mg | ORAL_TABLET | Freq: Every day | ORAL | Status: DC
  Administered 2021-11-01: 50 mg via ORAL
  Filled 2021-11-01: qty 1

## 2021-11-01 MED ORDER — LIDOCAINE HCL (PF) 2 % IJ SOLN
INTRAMUSCULAR | Status: AC
Start: 2021-11-01 — End: ?
  Filled 2021-11-01: qty 5

## 2021-11-01 MED ORDER — PROPOFOL 10 MG/ML IV BOLUS
INTRAVENOUS | Status: DC | PRN
  Administered 2021-11-01: 80 mg via INTRAVENOUS
  Administered 2021-11-01: 30 mg via INTRAVENOUS

## 2021-11-01 MED ORDER — PANTOPRAZOLE SODIUM 40 MG PO TBEC: 40.0000 mg | DELAYED_RELEASE_TABLET | Freq: Two times a day (BID) | ORAL | Status: DC

## 2021-11-01 MED ORDER — PANTOPRAZOLE SODIUM 40 MG IV SOLR
40.0000 mg | Freq: Two times a day (BID) | INTRAVENOUS | Status: DC
  Administered 2021-11-01 – 2021-11-02 (×2): 40 mg via INTRAVENOUS
  Filled 2021-11-01 (×2): qty 10

## 2021-11-01 MED ORDER — SODIUM CHLORIDE 0.9 % IV BOLUS
1000.0000 mL | Freq: Once | INTRAVENOUS | Status: AC
  Administered 2021-11-01: 1000 mL via INTRAVENOUS

## 2021-11-01 MED ORDER — MIDAZOLAM HCL 2 MG/2ML IJ SOLN
INTRAMUSCULAR | Status: AC
  Filled 2021-11-01: qty 2

## 2021-11-01 MED ORDER — PROPOFOL 500 MG/50ML IV EMUL
INTRAVENOUS | Status: DC | PRN
  Administered 2021-11-01: 140 ug/kg/min via INTRAVENOUS

## 2021-11-01 MED ORDER — INFLUENZA VAC SPLIT QUAD 0.5 ML IM SUSY
0.5000 mL | PREFILLED_SYRINGE | INTRAMUSCULAR | Status: AC
  Administered 2021-11-02: 0.5 mL via INTRAMUSCULAR
  Filled 2021-11-01: qty 0.5

## 2021-11-01 MED ORDER — LIDOCAINE HCL (CARDIAC) PF 100 MG/5ML IV SOSY
PREFILLED_SYRINGE | INTRAVENOUS | Status: DC | PRN
  Administered 2021-11-01: 50 mg via INTRAVENOUS

## 2021-11-01 MED ORDER — DEXMEDETOMIDINE (PRECEDEX) IN NS 20 MCG/5ML (4 MCG/ML) IV SYRINGE
PREFILLED_SYRINGE | INTRAVENOUS | Status: DC | PRN
  Administered 2021-11-01: 12 ug via INTRAVENOUS

## 2021-11-01 NOTE — Care Plan (Addendum)
Spoke with patient's mother on the phone and then with patient and mother in room post procedure. ? ?Extensive counseling provided on liver disease, management and prognosis- especially if patient continues to drink alcohol. ?Counseled on resources and medications available to help with cessation. ? ?>77mn spent in discussion with patient and her mother as above. An opportunity for questions was provided and appropriate answers given. They were appreciative of the time. ? ?Plan was also discussed with primary team ? ?SAnnamaria Helling DO ?[facility] ? ? ? ? ?

## 2021-11-01 NOTE — Assessment & Plan Note (Addendum)
Improved with IV fluid hydration.  Creatinine 1.26 and came down to 0.81 ?

## 2021-11-01 NOTE — Consult Note (Signed)
Inpatient Consultation   Patient ID: Abigail Burton is a 46 y.o. female.  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 Patient noted some "specks of red blood" in her emesis which prompted her to come to the hospital. GI consulted for UGIB.  Pt also noted "red" in her stool, but reports she had been eating a fair number of radishes. FOBT was negative as was rectal exam for signs of blood. Vital signs have been stable and hgb 12.6 on presentation remains relatively unchanged at 11.8 this morning. INR 1.4 and plt 78K. Abdominal pain she initially had on presentation has resolved. No further vomiting/nausea since arrival. Denies jaundice, fever, chills. No confusion as per mother. Last etoh drink was just prior to presentation. Pt has been actively drinking etoh but denies nsaid use. Has been taking home beta blocker (coreg), but not her home lactulose. Does take xifaxin at home. Reports 3+ BM at hoeme per day.   Patient's mother at bedside and requesting transfer to [facility].  Denies NSAIDs, Anti-plt agents, and anticoagulants Denies family history of gastrointestinal disease and malignancy Previous Endoscopies: Pt reports egd 1 year ago with [facility]- says she underwent EVL at that time    Past Medical History:  Diagnosis Date   Alcohol dependence ([place])    Asthma    Cancer ([place])    left shoulder melonoma with excision   DDD (degenerative disc disease), lumbosacral    Hepatitis C    Hypertension    Renal disorder     Past Surgical History:  Procedure Laterality Date   cancer removal      Allergies  Allergen Reactions   Sulfa Antibiotics Other (See Comments)     Reaction: unknown    Family History  Problem Relation Age of Onset   Hypertension Other     Social History   Tobacco Use   Smoking status: Every Day    Packs/day: 0.50    Types: Cigarettes   Smokeless tobacco: Never  Substance Use Topics   Alcohol use: Not Currently    Comment: "half a fifth per day"   Drug use: No     Pertinent GI related history and allergies were reviewed with the patient  Review of Systems  Constitutional:  Negative for activity change, appetite change, chills, diaphoresis, fatigue, fever and unexpected weight change.  HENT:  Negative for trouble swallowing and voice change.   Respiratory:  Negative for shortness of breath and wheezing.   Cardiovascular:  Negative for chest pain, palpitations and leg swelling.  Gastrointestinal:  Positive for abdominal pain, nausea and vomiting. Negative for abdominal distention, anal bleeding, blood in stool, constipation, diarrhea and rectal pain.  Musculoskeletal:  Negative for arthralgias and myalgias.  Skin:  Negative for color change and pallor.  Neurological:  Negative for dizziness, syncope and weakness.  Psychiatric/Behavioral:  Negative for confusion.   All other systems reviewed and are negative.   Medications Home Medications No current facility-administered medications on file prior to encounter.   Current Outpatient Medications on File Prior to Encounter  Medication Sig Dispense Refill   buPROPion (WELLBUTRIN XL) 300 MG 24 hr tablet Take 300 mg by  mouth daily.  3   furosemide (LASIX) 40 MG tablet Take 20 mg by mouth daily.     pantoprazole (PROTONIX) 40 MG tablet Take 1 tablet (40 mg total) by mouth daily. 30 tablet 0   rifaximin (XIFAXAN) 550 MG TABS tablet Take 1 tablet (550 mg total) by mouth 2 (two) times daily. 60 tablet 0   gabapentin (NEURONTIN) 300 MG capsule Take 900 mg by mouth 3 (three) times daily.  (Patient not taking: Reported on 10/31/2021)  0   lactulose (CHRONULAC) 10 GM/15ML solution  Take by mouth. (Patient not taking: Reported on 10/31/2021)     sertraline (ZOLOFT) 50 MG tablet Take 50 mg by mouth 2 (two) times a day.   3   spironolactone (ALDACTONE) 100 MG tablet Take by mouth.     trazodone (DESYREL) 300 MG tablet Take 300 mg by mouth at bedtime as needed for sleep.     Pertinent GI related medications were reviewed with the patient  Inpatient Medications  Current Facility-Administered Medications:    acetaminophen (TYLENOL) tablet 650 mg, 650 mg, Oral, Q6H PRN **OR** acetaminophen (TYLENOL) suppository 650 mg, 650 mg, Rectal, Q6H PRN, Athena Masse, MD   cefTRIAXone (ROCEPHIN) 1 g in sodium chloride 0.9 % 100 mL IVPB, 1 g, Intravenous, Q24H, Damita Dunnings, Waldemar Dickens, MD   folic acid (FOLVITE) tablet 1 mg, 1 mg, Oral, Daily, Athena Masse, MD   lactated ringers infusion, , Intravenous, Continuous, Athena Masse, MD, Last Rate: 100 mL/hr at 11/01/21 0011, New Bag at 11/01/21 0011   LORazepam (ATIVAN) injection 0-4 mg, 0-4 mg, Intravenous, Q6H **FOLLOWED BY** [START ON 11/02/2021] LORazepam (ATIVAN) injection 0-4 mg, 0-4 mg, Intravenous, Q12H, Damita Dunnings, Waldemar Dickens, MD   LORazepam (ATIVAN) tablet 1-4 mg, 1-4 mg, Oral, Q1H PRN **OR** LORazepam (ATIVAN) injection 1-4 mg, 1-4 mg, Intravenous, Q1H PRN, Athena Masse, MD   metoCLOPramide (REGLAN) injection 10 mg, 10 mg, Intravenous, Q6H, [name], DO, 10 mg at 11/01/21 0534   morphine (PF) 2 MG/ML injection 2 mg, 2 mg, Intravenous, Q2H PRN, Athena Masse, MD, 2 mg at 11/01/21 0000   multivitamin with minerals tablet 1 tablet, 1 tablet, Oral, Daily, Athena Masse, MD   [COMPLETED] octreotide (SANDOSTATIN) 2 mcg/mL load via infusion 50 mcg, 50 mcg, Intravenous, Once, 50 mcg at 10/31/21 2135 **AND** octreotide (SANDOSTATIN) 500 mcg in sodium chloride 0.9 % 250 mL (2 mcg/mL) infusion, 50 mcg/hr, Intravenous, Continuous, Nance Pear, MD, Last Rate: 25 mL/hr at 11/01/21 0729, 50 mcg/hr at 11/01/21 0729   ondansetron (ZOFRAN)  tablet 4 mg, 4 mg, Oral, Q6H PRN **OR** ondansetron (ZOFRAN) injection 4 mg, 4 mg, Intravenous, Q6H PRN, Athena Masse, MD   [START ON 11/04/2021] pantoprazole (PROTONIX) injection 40 mg, 40 mg, Intravenous, Q12H, Nance Pear, MD   pantoprozole (PROTONIX) 80 mg /NS 100 mL infusion, 8 mg/hr, Intravenous, Continuous, Nance Pear, MD, Last Rate: 10 mL/hr at 11/01/21 0710, 8 mg/hr at 11/01/21 0710   thiamine tablet 100 mg, 100 mg, Oral, Daily **OR** thiamine (B-1) injection 100 mg, 100 mg, Intravenous, Daily, Athena Masse, MD  Current Outpatient Medications:    buPROPion (WELLBUTRIN XL) 300 MG 24 hr tablet, Take 300 mg by mouth daily., Disp: , Rfl: 3   furosemide (LASIX) 40 MG tablet, Take 20 mg by mouth daily., Disp: , Rfl:    pantoprazole (PROTONIX) 40 MG tablet, Take 1 tablet (40 mg total) by mouth daily., Disp: 30 tablet, Rfl: 0   rifaximin Doreene Nest)  550 MG TABS tablet, Take 1 tablet (550 mg total) by mouth 2 (two) times daily., Disp: 60 tablet, Rfl: 0   gabapentin (NEURONTIN) 300 MG capsule, Take 900 mg by mouth 3 (three) times daily.  (Patient not taking: Reported on 10/31/2021), Disp: , Rfl: 0   lactulose (CHRONULAC) 10 GM/15ML solution, Take by mouth. (Patient not taking: Reported on 10/31/2021), Disp: , Rfl:    sertraline (ZOLOFT) 50 MG tablet, Take 50 mg by mouth 2 (two) times a day. , Disp: , Rfl: 3   spironolactone (ALDACTONE) 100 MG tablet, Take by mouth., Disp: , Rfl:    trazodone (DESYREL) 300 MG tablet, Take 300 mg by mouth at bedtime as needed for sleep., Disp: , Rfl:   cefTRIAXone (ROCEPHIN)  IV     lactated ringers 100 mL/hr at 11/01/21 0011   octreotide  (SANDOSTATIN)    IV infusion 50 mcg/hr (11/01/21 0729)   pantoprazole 8 mg/hr (11/01/21 0710)    acetaminophen **OR** acetaminophen, LORazepam **OR** LORazepam, morphine injection, ondansetron **OR** ondansetron (ZOFRAN) IV   Objective   Vitals:   11/01/21 0526 11/01/21 0543 11/01/21 0600 11/01/21 0630  BP: 91/60  '91/60 93/60 95/60 '$  Pulse: 78 78 74 76  Resp:   14 15  Temp:      TempSrc:      SpO2: 93%  96% 97%  Weight:      Height:         Physical Exam Vitals and nursing note reviewed.  Constitutional:      General: She is not in acute distress.    Appearance: She is not toxic-appearing or diaphoretic.     Comments: Appears older than stated age  HENT:     Head: Normocephalic and atraumatic.     Nose: Nose normal.     Mouth/Throat:     Mouth: Mucous membranes are moist.     Pharynx: Oropharynx is clear.  Eyes:     General: No scleral icterus.    Extraocular Movements: Extraocular movements intact.  Cardiovascular:     Rate and Rhythm: Normal rate and regular rhythm.     Heart sounds: Normal heart sounds. No murmur heard.   No friction rub. No gallop.  Pulmonary:     Effort: Pulmonary effort is normal. No respiratory distress.     Breath sounds: Normal breath sounds. No wheezing, rhonchi or rales.  Abdominal:     General: Bowel sounds are normal. There is no distension.     Palpations: Abdomen is soft.     Tenderness: There is no abdominal tenderness. There is no guarding or rebound.     Comments: No rigidity, non peritoneal  Musculoskeletal:     Cervical back: Neck supple.     Right lower leg: No edema.     Left lower leg: No edema.  Skin:    General: Skin is warm and dry.     Coloration: Skin is not jaundiced or pale.     Comments: Telangiectasias on face  Neurological:     General: No focal deficit present.     Mental Status: She is alert and oriented to person, place, and time. Mental status is at baseline.  Psychiatric:        Mood and Affect: Mood normal.        Behavior: Behavior normal.        Thought Content: Thought content normal.        Judgment: Judgment normal.    Laboratory Data Recent Labs  Lab  10/31/21 1934 11/01/21 0239  WBC 8.1  --   HGB 12.6 11.8*  HCT 36.2 35.1*  PLT 78*  --    Recent Labs  Lab 10/31/21 1934 11/01/21 0521  NA 137 140   K 4.5 4.4  CL 100 105  CO2 25 25  BUN 16 14  CALCIUM 8.9 8.1*  PROT 7.2 6.2*  BILITOT 0.8 1.0  ALKPHOS 95 75  ALT 59* 49*  AST 140* 91*  GLUCOSE 104* 81   Recent Labs  Lab 10/31/21 2002 11/01/21 0521  INR 1.4* 1.4*    No results for input(s): LIPASE in the last 72 hours.      Imaging Studies: No results found.  Assessment:   # Cirrhosis 2/2 etoh and HCV - actively on epclusa for HCV - pt reports not taking her lactulose, but having 3+ BM per day - on bb at home - MELD-Na 14; Mdf 18.7  # UGIB - specks of blood on vomit per patient - FOBT negative- had been eating radishes and felt red stool was related to that - vss  - no further episodes of n/v since arrival at hospital  # Acute on chronic macrocytic anemia - hgb on presentation 12.6- only down to 11.8 today - vomiting "specks" of blood per patient - suspect bone marrow suppression from etoh a contributing factor to anemia  # Portal HTN # coagulopathy- inr 1.4 # hypoalbuminemia  # n/v - suspect related to active thc and etoh use  # Alcoholic hepatitis- improving - mdf 18.7  # AKI- resolving  # abdominal discomfort- resolved  # etoh abuse  # THC use  Plan:  EGD planned for today NPO now Labs reviewed; fecal occult blood negative Continue octreotide gtt for total 72 hours Continue protonix 40 mg iv q12 h Continue Ceftriaxone 1 g iv q24 for 7 days total for sbp ppx Monitor H&H.  Transfusion and resuscitation as per primary team Avoid frequent lab draws to prevent lab induced anemia Supportive care, IVF and antiemetics as per primary team Maintain two sites IV access Avoid nsaids Monitor for GIB. Awaiting [facility] records UDS + for [facility] PETH pending CIWA protocol as per primary team Further recommendations pending endoscopy - please see post op report for further information once completed  Esophagogastroduodenoscopy with possible biopsy, control of bleeding, polypectomy, and interventions as  necessary has been discussed with the patient/patient representative. Informed consent was obtained from the patient/patient representative after explaining the indication, nature, and risks of the procedure including but not limited to death, bleeding, perforation, missed neoplasm/lesions, cardiorespiratory compromise, and reaction to medications. Opportunity for questions was given and appropriate answers were provided. Patient/patient representative has verbalized understanding is amenable to undergoing the procedure.  I personally performed the service.  Management of other medical comorbidities as per primary team  Thank you for allowing [place] to participate in this patient's care. Please don't hesitate to call if any questions or concerns arise.   [name], DO [facility]  Portions of the record may have been created with voice recognition software. Occasional wrong-word or 'sound-a-like' substitutions may have occurred due to the inherent limitations of voice recognition software.  Read the chart carefully and recognize, using context, where substitutions may have occurred.

## 2021-11-01 NOTE — Transfer of Care (Signed)
Immediate Anesthesia Transfer of Care Note ? ?Patient: Abigail Burton ? ?Procedure(s) Performed: ESOPHAGOGASTRODUODENOSCOPY (EGD) WITH PROPOFOL ? ?Patient Location: PACU ? ?Anesthesia Type:General ? ?Level of Consciousness: awake, alert  and oriented ? ?Airway & Oxygen Therapy: Patient Spontanous Breathing ? ?Post-op Assessment: Report given to RN and Post -op Vital signs reviewed and stable ? ?Post vital signs: Reviewed and stable ? ?Last Vitals:  ?Vitals Value Taken Time  ?BP 109/72 11/01/21 1352  ?Temp 36.1 ?C 11/01/21 1352  ?Pulse 87 11/01/21 1352  ?Resp 15 11/01/21 1352  ?SpO2 94 % 11/01/21 1352  ? ? ?Last Pain:  ?Vitals:  ? 11/01/21 1352  ?TempSrc: Temporal  ?PainSc: Asleep  ?   ? ?  ? ?Complications: No notable events documented. ?

## 2021-11-01 NOTE — ED Notes (Addendum)
Holding PO medications at this time due to scheduled procedure, EGD today--approx noon/early afternoon per patient. Orders for NPO in chart. ?

## 2021-11-01 NOTE — Progress Notes (Signed)
?  Progress Note ? ? ?Patient: Abigail Burton DGL:[id number] DOB: 01-27-1976 DOA: 10/31/2021     1 ?DOS: the patient was seen and examined on 11/01/2021 ?  ? ? ?Assessment and Plan: ?* Hematemesis ?EGD shows a shallow duodenal ulcer, portal hypertensive gastropathy and grade 2 esophageal varices.  Case discussed with gastroenterology and recommend 72 hours of octreotide drip and patient also on Protonix drip.  Empiric Rocephin for now.  Last hemoglobin 11.3.  Patient started on clear liquid diet. ? ?Alcoholic cirrhosis of liver with ascites ([place]) ?Patient declined paracentesis this morning so I canceled the test.  Restart spironolactone.  Hopefully tomorrow can restart Lasix.  Told the patient and the patient's mother that she must be alcohol free for at least a year before they would even consider a liver transplant ? ?Alcohol use disorder, moderate, dependence ([place]) ?No signs of alcohol withdrawal this morning.  Advised she must stop alcohol. ? ?AKI (acute kidney injury) ([place]) ?Improved with IV fluid hydration ? ?Elevated liver function tests ?Secondary to alcohol and trending better ? ?Abdominal pain ?Patient states her abdominal pain was from her recent abdominal hernia surgery. ? ?HTN (hypertension) ?Restart spironolactone. ? ? ? ? ?  ? ?Subjective: Patient this morning was feeling a little bit better with regards to her abdominal pain.  No further vomiting blood.  Feeling better than when she came in.  Patient declined paracentesis ? ?Physical Exam: ?Vitals:  ? 11/01/21 1512 11/01/21 1537 11/01/21 1559 11/01/21 1604  ?BP: 125/73 110/75 (!) 144/88 (!) 144/88  ?Pulse: 72 69 64 64  ?Resp: '16 20 19   '$ ?Temp:   98.3 ?F (36.8 ?C)   ?TempSrc:   Oral   ?SpO2: 92% 93% 98%   ?Weight:      ?Height:      ? ?Physical Exam ?HENT:  ?   Head: Normocephalic.  ?   Mouth/Throat:  ?   Pharynx: No oropharyngeal exudate.  ?Eyes:  ?   General: Lids are normal.  ?   Conjunctiva/sclera: Conjunctivae normal.  ?Cardiovascular:  ?   Rate and  Rhythm: Normal rate and regular rhythm.  ?   Heart sounds: Normal heart sounds, S1 normal and S2 normal.  ?Pulmonary:  ?   Breath sounds: Normal breath sounds. No decreased breath sounds, wheezing, rhonchi or rales.  ?Abdominal:  ?   Palpations: Abdomen is soft.  ?   Tenderness: There is no abdominal tenderness.  ?Musculoskeletal:  ?   Right lower leg: No swelling.  ?   Left lower leg: No swelling.  ?Skin: ?   General: Skin is warm.  ?   Findings: No rash.  ?Neurological:  ?   Mental Status: She is alert and oriented to person, place, and time.  ?  ?Data Reviewed: ?Last hemoglobin 11.3, AST 91 and ALT 49, creatinine went from 1.26 down to 0.85 ? ?Family Communication: Spoke with patient's mother at the bed ? ?Disposition: ?Status is: Inpatient ?Remains inpatient appropriate because: Gastroenterology recommends octreotide drip for 72 hours ? ?Planned Discharge Destination: Home ? ?Author: ?Loletha Grayer, MD ?11/01/2021 4:47 PM ? ?For on call review [email or web address].  ?

## 2021-11-01 NOTE — ED Notes (Signed)
Ambulatory to bathroom. Gait steady. No signs of distress. No complaints of pain. No active vomiting or bleeding. VSS. Side rails up. Call bell in reach. Mother at bedside.  ?

## 2021-11-01 NOTE — H&P (View-Only) (Signed)
Inpatient Consultation   Patient ID: Abigail Burton is a 46 y.o. female.  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 Patient noted some "specks of red blood" in her emesis which prompted her to come to the hospital. GI consulted for UGIB.  Pt also noted "red" in her stool, but reports she had been eating a fair number of radishes. FOBT was negative as was rectal exam for signs of blood. Vital signs have been stable and hgb 12.6 on presentation remains relatively unchanged at 11.8 this morning. INR 1.4 and plt 78K. Abdominal pain she initially had on presentation has resolved. No further vomiting/nausea since arrival. Denies jaundice, fever, chills. No confusion as per mother. Last etoh drink was just prior to presentation. Pt has been actively drinking etoh but denies nsaid use. Has been taking home beta blocker (coreg), but not her home lactulose. Does take xifaxin at home. Reports 3+ BM at hoeme per day.   Patient's mother at bedside and requesting transfer to [facility].  Denies NSAIDs, Anti-plt agents, and anticoagulants Denies family history of gastrointestinal disease and malignancy Previous Endoscopies: Pt reports egd 1 year ago with [facility]- says she underwent EVL at that time    Past Medical History:  Diagnosis Date   Alcohol dependence ([place])    Asthma    Cancer ([place])    left shoulder melonoma with excision   DDD (degenerative disc disease), lumbosacral    Hepatitis C    Hypertension    Renal disorder     Past Surgical History:  Procedure Laterality Date   cancer removal      Allergies  Allergen Reactions   Sulfa Antibiotics Other (See Comments)     Reaction: unknown    Family History  Problem Relation Age of Onset   Hypertension Other     Social History   Tobacco Use   Smoking status: Every Day    Packs/day: 0.50    Types: Cigarettes   Smokeless tobacco: Never  Substance Use Topics   Alcohol use: Not Currently    Comment: "half a fifth per day"   Drug use: No     Pertinent GI related history and allergies were reviewed with the patient  Review of Systems  Constitutional:  Negative for activity change, appetite change, chills, diaphoresis, fatigue, fever and unexpected weight change.  HENT:  Negative for trouble swallowing and voice change.   Respiratory:  Negative for shortness of breath and wheezing.   Cardiovascular:  Negative for chest pain, palpitations and leg swelling.  Gastrointestinal:  Positive for abdominal pain, nausea and vomiting. Negative for abdominal distention, anal bleeding, blood in stool, constipation, diarrhea and rectal pain.  Musculoskeletal:  Negative for arthralgias and myalgias.  Skin:  Negative for color change and pallor.  Neurological:  Negative for dizziness, syncope and weakness.  Psychiatric/Behavioral:  Negative for confusion.   All other systems reviewed and are negative.   Medications Home Medications No current facility-administered medications on file prior to encounter.   Current Outpatient Medications on File Prior to Encounter  Medication Sig Dispense Refill   buPROPion (WELLBUTRIN XL) 300 MG 24 hr tablet Take 300 mg by  mouth daily.  3   furosemide (LASIX) 40 MG tablet Take 20 mg by mouth daily.     pantoprazole (PROTONIX) 40 MG tablet Take 1 tablet (40 mg total) by mouth daily. 30 tablet 0   rifaximin (XIFAXAN) 550 MG TABS tablet Take 1 tablet (550 mg total) by mouth 2 (two) times daily. 60 tablet 0   gabapentin (NEURONTIN) 300 MG capsule Take 900 mg by mouth 3 (three) times daily.  (Patient not taking: Reported on 10/31/2021)  0   lactulose (CHRONULAC) 10 GM/15ML solution  Take by mouth. (Patient not taking: Reported on 10/31/2021)     sertraline (ZOLOFT) 50 MG tablet Take 50 mg by mouth 2 (two) times a day.   3   spironolactone (ALDACTONE) 100 MG tablet Take by mouth.     trazodone (DESYREL) 300 MG tablet Take 300 mg by mouth at bedtime as needed for sleep.     Pertinent GI related medications were reviewed with the patient  Inpatient Medications  Current Facility-Administered Medications:    acetaminophen (TYLENOL) tablet 650 mg, 650 mg, Oral, Q6H PRN **OR** acetaminophen (TYLENOL) suppository 650 mg, 650 mg, Rectal, Q6H PRN, Athena Masse, MD   cefTRIAXone (ROCEPHIN) 1 g in sodium chloride 0.9 % 100 mL IVPB, 1 g, Intravenous, Q24H, Damita Dunnings, Waldemar Dickens, MD   folic acid (FOLVITE) tablet 1 mg, 1 mg, Oral, Daily, Athena Masse, MD   lactated ringers infusion, , Intravenous, Continuous, Athena Masse, MD, Last Rate: 100 mL/hr at 11/01/21 0011, New Bag at 11/01/21 0011   LORazepam (ATIVAN) injection 0-4 mg, 0-4 mg, Intravenous, Q6H **FOLLOWED BY** [START ON 11/02/2021] LORazepam (ATIVAN) injection 0-4 mg, 0-4 mg, Intravenous, Q12H, Damita Dunnings, Waldemar Dickens, MD   LORazepam (ATIVAN) tablet 1-4 mg, 1-4 mg, Oral, Q1H PRN **OR** LORazepam (ATIVAN) injection 1-4 mg, 1-4 mg, Intravenous, Q1H PRN, Athena Masse, MD   metoCLOPramide (REGLAN) injection 10 mg, 10 mg, Intravenous, Q6H, [name], DO, 10 mg at 11/01/21 0534   morphine (PF) 2 MG/ML injection 2 mg, 2 mg, Intravenous, Q2H PRN, Athena Masse, MD, 2 mg at 11/01/21 0000   multivitamin with minerals tablet 1 tablet, 1 tablet, Oral, Daily, Athena Masse, MD   [COMPLETED] octreotide (SANDOSTATIN) 2 mcg/mL load via infusion 50 mcg, 50 mcg, Intravenous, Once, 50 mcg at 10/31/21 2135 **AND** octreotide (SANDOSTATIN) 500 mcg in sodium chloride 0.9 % 250 mL (2 mcg/mL) infusion, 50 mcg/hr, Intravenous, Continuous, Nance Pear, MD, Last Rate: 25 mL/hr at 11/01/21 0729, 50 mcg/hr at 11/01/21 0729   ondansetron (ZOFRAN)  tablet 4 mg, 4 mg, Oral, Q6H PRN **OR** ondansetron (ZOFRAN) injection 4 mg, 4 mg, Intravenous, Q6H PRN, Athena Masse, MD   [START ON 11/04/2021] pantoprazole (PROTONIX) injection 40 mg, 40 mg, Intravenous, Q12H, Nance Pear, MD   pantoprozole (PROTONIX) 80 mg /NS 100 mL infusion, 8 mg/hr, Intravenous, Continuous, Nance Pear, MD, Last Rate: 10 mL/hr at 11/01/21 0710, 8 mg/hr at 11/01/21 0710   thiamine tablet 100 mg, 100 mg, Oral, Daily **OR** thiamine (B-1) injection 100 mg, 100 mg, Intravenous, Daily, Athena Masse, MD  Current Outpatient Medications:    buPROPion (WELLBUTRIN XL) 300 MG 24 hr tablet, Take 300 mg by mouth daily., Disp: , Rfl: 3   furosemide (LASIX) 40 MG tablet, Take 20 mg by mouth daily., Disp: , Rfl:    pantoprazole (PROTONIX) 40 MG tablet, Take 1 tablet (40 mg total) by mouth daily., Disp: 30 tablet, Rfl: 0   rifaximin Doreene Nest)  550 MG TABS tablet, Take 1 tablet (550 mg total) by mouth 2 (two) times daily., Disp: 60 tablet, Rfl: 0   gabapentin (NEURONTIN) 300 MG capsule, Take 900 mg by mouth 3 (three) times daily.  (Patient not taking: Reported on 10/31/2021), Disp: , Rfl: 0   lactulose (CHRONULAC) 10 GM/15ML solution, Take by mouth. (Patient not taking: Reported on 10/31/2021), Disp: , Rfl:    sertraline (ZOLOFT) 50 MG tablet, Take 50 mg by mouth 2 (two) times a day. , Disp: , Rfl: 3   spironolactone (ALDACTONE) 100 MG tablet, Take by mouth., Disp: , Rfl:    trazodone (DESYREL) 300 MG tablet, Take 300 mg by mouth at bedtime as needed for sleep., Disp: , Rfl:   cefTRIAXone (ROCEPHIN)  IV     lactated ringers 100 mL/hr at 11/01/21 0011   octreotide  (SANDOSTATIN)    IV infusion 50 mcg/hr (11/01/21 0729)   pantoprazole 8 mg/hr (11/01/21 0710)    acetaminophen **OR** acetaminophen, LORazepam **OR** LORazepam, morphine injection, ondansetron **OR** ondansetron (ZOFRAN) IV   Objective   Vitals:   11/01/21 0526 11/01/21 0543 11/01/21 0600 11/01/21 0630  BP: 91/60  '91/60 93/60 95/60 '$  Pulse: 78 78 74 76  Resp:   14 15  Temp:      TempSrc:      SpO2: 93%  96% 97%  Weight:      Height:         Physical Exam Vitals and nursing note reviewed.  Constitutional:      General: She is not in acute distress.    Appearance: She is not toxic-appearing or diaphoretic.     Comments: Appears older than stated age  HENT:     Head: Normocephalic and atraumatic.     Nose: Nose normal.     Mouth/Throat:     Mouth: Mucous membranes are moist.     Pharynx: Oropharynx is clear.  Eyes:     General: No scleral icterus.    Extraocular Movements: Extraocular movements intact.  Cardiovascular:     Rate and Rhythm: Normal rate and regular rhythm.     Heart sounds: Normal heart sounds. No murmur heard.   No friction rub. No gallop.  Pulmonary:     Effort: Pulmonary effort is normal. No respiratory distress.     Breath sounds: Normal breath sounds. No wheezing, rhonchi or rales.  Abdominal:     General: Bowel sounds are normal. There is no distension.     Palpations: Abdomen is soft.     Tenderness: There is no abdominal tenderness. There is no guarding or rebound.     Comments: No rigidity, non peritoneal  Musculoskeletal:     Cervical back: Neck supple.     Right lower leg: No edema.     Left lower leg: No edema.  Skin:    General: Skin is warm and dry.     Coloration: Skin is not jaundiced or pale.     Comments: Telangiectasias on face  Neurological:     General: No focal deficit present.     Mental Status: She is alert and oriented to person, place, and time. Mental status is at baseline.  Psychiatric:        Mood and Affect: Mood normal.        Behavior: Behavior normal.        Thought Content: Thought content normal.        Judgment: Judgment normal.    Laboratory Data Recent Labs  Lab  10/31/21 1934 11/01/21 0239  WBC 8.1  --   HGB 12.6 11.8*  HCT 36.2 35.1*  PLT 78*  --    Recent Labs  Lab 10/31/21 1934 11/01/21 0521  NA 137 140   K 4.5 4.4  CL 100 105  CO2 25 25  BUN 16 14  CALCIUM 8.9 8.1*  PROT 7.2 6.2*  BILITOT 0.8 1.0  ALKPHOS 95 75  ALT 59* 49*  AST 140* 91*  GLUCOSE 104* 81   Recent Labs  Lab 10/31/21 2002 11/01/21 0521  INR 1.4* 1.4*    No results for input(s): LIPASE in the last 72 hours.      Imaging Studies: No results found.  Assessment:   # Cirrhosis 2/2 etoh and HCV - actively on epclusa for HCV - pt reports not taking her lactulose, but having 3+ BM per day - on bb at home - MELD-Na 14; Mdf 18.7  # UGIB - specks of blood on vomit per patient - FOBT negative- had been eating radishes and felt red stool was related to that - vss  - no further episodes of n/v since arrival at hospital  # Acute on chronic macrocytic anemia - hgb on presentation 12.6- only down to 11.8 today - vomiting "specks" of blood per patient - suspect bone marrow suppression from etoh a contributing factor to anemia  # Portal HTN # coagulopathy- inr 1.4 # hypoalbuminemia  # n/v - suspect related to active thc and etoh use  # Alcoholic hepatitis- improving - mdf 18.7  # AKI- resolving  # abdominal discomfort- resolved  # etoh abuse  # THC use  Plan:  EGD planned for today NPO now Labs reviewed; fecal occult blood negative Continue octreotide gtt for total 72 hours Continue protonix 40 mg iv q12 h Continue Ceftriaxone 1 g iv q24 for 7 days total for sbp ppx Monitor H&H.  Transfusion and resuscitation as per primary team Avoid frequent lab draws to prevent lab induced anemia Supportive care, IVF and antiemetics as per primary team Maintain two sites IV access Avoid nsaids Monitor for GIB. Awaiting [facility] records UDS + for [facility] PETH pending CIWA protocol as per primary team Further recommendations pending endoscopy - please see post op report for further information once completed  Esophagogastroduodenoscopy with possible biopsy, control of bleeding, polypectomy, and interventions as  necessary has been discussed with the patient/patient representative. Informed consent was obtained from the patient/patient representative after explaining the indication, nature, and risks of the procedure including but not limited to death, bleeding, perforation, missed neoplasm/lesions, cardiorespiratory compromise, and reaction to medications. Opportunity for questions was given and appropriate answers were provided. Patient/patient representative has verbalized understanding is amenable to undergoing the procedure.  I personally performed the service.  Management of other medical comorbidities as per primary team  Thank you for allowing [place] to participate in this patient's care. Please don't hesitate to call if any questions or concerns arise.   [name], DO [facility]  Portions of the record may have been created with voice recognition software. Occasional wrong-word or 'sound-a-like' substitutions may have occurred due to the inherent limitations of voice recognition software.  Read the chart carefully and recognize, using context, where substitutions may have occurred.

## 2021-11-01 NOTE — Anesthesia Preprocedure Evaluation (Signed)
Anesthesia Evaluation  ?Patient identified by MRN, date of birth, ID band ?Patient awake ? ? ? ?Reviewed: ?Allergy & Precautions, H&P , NPO status , Patient's Chart, lab work & pertinent test results, reviewed documented beta blocker date and time  ? ?Airway ?Mallampati: II ? ? ?Neck ROM: full ? ? ? Dental ? ?(+) Poor Dentition ?  ?Pulmonary ?neg shortness of breath, asthma , Current Smoker and Patient abstained from smoking.,  ?  ?Pulmonary exam normal ? ? ? ? ? ? ? Cardiovascular ?Exercise Tolerance: Good ?hypertension, On Medications ?negative cardio ROS ?Normal cardiovascular exam ?Rhythm:regular Rate:Normal ? ? ?  ?Neuro/Psych ?PSYCHIATRIC DISORDERS Anxiety Depression negative neurological ROS ?   ? GI/Hepatic ?negative GI ROS, (+) Hepatitis -  ?Endo/Other  ?negative endocrine ROS ? Renal/GU ?Renal disease  ?negative genitourinary ?  ?Musculoskeletal ? ? Abdominal ?  ?Peds ? Hematology ? ?(+) Blood dyscrasia, anemia ,   ?Anesthesia Other Findings ?Past Medical History: ?No date: Alcohol dependence ([place]) ?No date: Asthma ?No date: Cancer [facility]) ?    Comment:  left shoulder melonoma with excision ?No date: DDD (degenerative disc disease), lumbosacral ?No date: Hepatitis C ?No date: Hypertension ?No date: Renal disorder ?Past Surgical History: ?No date: cancer removal ?BMI   ? Body Mass Index: 22.86 kg/m?  ?  ? Reproductive/Obstetrics ?negative OB ROS ? ?  ? ? ? ? ? ? ? ? ? ? ? ? ? ?  ?  ? ? ? ? ? ? ? ? ?Anesthesia Physical ?Anesthesia Plan ? ?ASA: 3 ? ?Anesthesia Plan: General  ? ?Post-op Pain Management:   ? ?Induction:  ? ?PONV Risk Score and Plan:  ? ?Airway Management Planned:  ? ?Additional Equipment:  ? ?Intra-op Plan:  ? ?Post-operative Plan:  ? ?Informed Consent: I have reviewed the patients History and Physical, chart, labs and discussed the procedure including the risks, benefits and alternatives for the proposed anesthesia with the patient or authorized representative  who has indicated his/her understanding and acceptance.  ? ? ? ?Dental Advisory Given ? ?Plan Discussed with: CRNA ? ?Anesthesia Plan Comments:   ? ? ? ? ? ? ?Anesthesia Quick Evaluation ? ?

## 2021-11-01 NOTE — Interval H&P Note (Signed)
History and Physical Interval Note: Consult Note from 11/01/21 ? was reviewed and there was no interval change after seeing and examining the patient.  Written consent was obtained from the patient after discussion of risks, benefits, and alternatives. Patient has consented to proceed with Esophagogastroduodenoscopy with possible intervention ? ? ?11/01/2021 ?12:40 PM ? ?Abigail Burton  has presented today for surgery, with the diagnosis of cirrhosis, hematemesis.  The various methods of treatment have been discussed with the patient and family. After consideration of risks, benefits and other options for treatment, the patient has consented to  Procedure(s): ?ESOPHAGOGASTRODUODENOSCOPY (EGD) WITH PROPOFOL (N/A) as a surgical intervention.  The patient's history has been reviewed, patient examined, no change in status, stable for surgery.  I have reviewed the patient's chart and labs.  Questions were answered to the patient's satisfaction.   ? ? ?[name] ? ? ?

## 2021-11-01 NOTE — Assessment & Plan Note (Addendum)
Secondary to alcohol. ?

## 2021-11-01 NOTE — Op Note (Signed)
[facility] ?Gastroenterology ?Patient Name: Abigail Burton ?Procedure Date: 11/01/2021 1:28 PM ?MRN: [id number] ?Account #: [id number] ?Date of Birth: [birth year] ?Admit Type: Outpatient ?Age: 46 ?Room: [facility] ENDO ROOM 2 ?Gender: Female ?Note Status: Finalized ?Instrument Name: Upper Endoscope [id number] ?Procedure:             Upper GI endoscopy ?Indications:           Cirrhosis with UGI bleeding suspected esophageal  ?                       varices ?Providers:             [name] DO, DO ?Referring MD:          No Local Md, MD (Referring MD) ?Medicines:             Monitored Anesthesia Care ?Complications:         No immediate complications. Estimated blood loss:  ?                       Minimal. ?Procedure:             Pre-Anesthesia Assessment: ?                       - Prior to the procedure, a History and Physical was  ?                       performed, and patient medications and allergies were  ?                       reviewed. The patient is competent. The risks and  ?                       benefits of the procedure and the sedation options and  ?                       risks were discussed with the patient. All questions  ?                       were answered and informed consent was obtained.  ?                       Patient identification and proposed procedure were  ?                       verified by the physician, the nurse, the anesthetist  ?                       and the technician in the endoscopy suite. Mental  ?                       Status Examination: alert and oriented. Airway  ?                       Examination: normal oropharyngeal airway and neck  ?                       mobility. Respiratory Examination: clear to  ?  auscultation. CV Examination: RRR, no murmurs, no S3  ?                       or S4. Prophylactic Antibiotics: The patient does not  ?                       require prophylactic antibiotics. Prior  ?                        Anticoagulants: The patient has taken no previous  ?                       anticoagulant or antiplatelet agents. ASA Grade  ?                       Assessment: IV - A patient with severe systemic  ?                       disease that is a constant threat to life. After  ?                       reviewing the risks and benefits, the patient was  ?                       deemed in satisfactory condition to undergo the  ?                       procedure. The anesthesia plan was to use monitored  ?                       anesthesia care (MAC). Immediately prior to  ?                       administration of medications, the patient was  ?                       re-assessed for adequacy to receive sedatives. The  ?                       heart rate, respiratory rate, oxygen saturations,  ?                       blood pressure, adequacy of pulmonary ventilation, and  ?                       response to care were monitored throughout the  ?                       procedure. The physical status of the patient was  ?                       re-assessed after the procedure. ?                       After obtaining informed consent, the endoscope was  ?                       passed under direct vision. Throughout the procedure,  ?  the patient's blood pressure, pulse, and oxygen  ?                       saturations were monitored continuously. The Endoscope  ?                       was introduced through the mouth, and advanced to the  ?                       second part of duodenum. The upper GI endoscopy was  ?                       accomplished without difficulty. The patient tolerated  ?                       the procedure well. ?Findings: ?     One non-bleeding superficial duodenal ulcer with a clean ulcer base  ?     (Forrest Class III) was found in the first portion of the duodenum. The  ?     lesion was 3 mm in largest dimension. Estimated blood loss: none. ?     A few localized erosions without bleeding  were found in the duodenal  ?     bulb. Estimated blood loss: none. ?     Mild portal hypertensive gastropathy was found in the entire examined  ?     stomach. Estimated blood loss: none. ?     Localized moderate inflammation characterized by erythema was found in  ?     the gastric antrum. Biopsies were taken with a cold forceps for  ?     Helicobacter pylori testing. Estimated blood loss was minimal. ?     Grade II varices (two columns) were found in the lower third of the  ?     esophagus. Estimated blood loss: none. No stigmata of recent bleeding-  ?     no red wale or nipple sign. ?     Normal mucosa was found in the entire esophagus. Estimated blood loss:  ?     none. ?Impression:            - Non-bleeding duodenal ulcer with a clean ulcer base  ?                       (Forrest Class III). ?                       - Duodenal erosions without bleeding. ?                       - Portal hypertensive gastropathy. ?                       - Gastritis. Biopsied. ?                       - Grade II esophageal varices. ?                       - Normal mucosa was found in the entire esophagus. ?Recommendation:        - Discharge patient to home. ?                       -  Full liquid diet. ?                       - Continue present medications. ?                       - Await pathology results. ?                       - Repeat upper endoscopy. ?                       - Return to GI office as previously scheduled. ?                       - The findings and recommendations were discussed with  ?                       the patient's family. ?                       - The findings and recommendations were discussed with  ?                       the patient. ?Procedure Code(s):     --- Professional --- ?                       [id number], Esophagogastroduodenoscopy, flexible,  ?                       transoral; with biopsy, single or multiple ?Diagnosis Code(s):     --- Professional --- ?                       K26.9, Duodenal ulcer,  unspecified as acute or  ?                       chronic, without hemorrhage or perforation ?                       K76.6, Portal hypertension ?                       K31.89, Other diseases of stomach and duodenum ?                       K29.70, Gastritis, unspecified, without bleeding ?                       K74.60, Unspecified cirrhosis of liver ?                       I85.10, Secondary esophageal varices without bleeding ?                       K92.2, Gastrointestinal hemorrhage, unspecified ?CPT copyright 2019 [employer] Medical Association. All rights reserved. ?The codes documented in this report are preliminary and upon coder review may  ?be revised to meet current compliance requirements. ?Attending Participation: ?     I personally performed the entire procedure. ?Volney American, DO ?[name] DO, DO ?11/01/2021 2:04:42 PM ?This report has been signed electronically. ?Number of Addenda: 0 ?Note Initiated On: 11/01/2021  1:28 PM ?Estimated Blood Loss:  Estimated blood loss was minimal. ?     [facility] ?

## 2021-11-01 NOTE — ED Notes (Signed)
Pt is ambulatory to the restroom at this time. No assistance needed.  ?

## 2021-11-02 ENCOUNTER — Encounter: Payer: Self-pay | Admitting: Gastroenterology

## 2021-11-02 DIAGNOSIS — B182 Chronic viral hepatitis C: Secondary | ICD-10-CM

## 2021-11-02 DIAGNOSIS — F1020 Alcohol dependence, uncomplicated: Secondary | ICD-10-CM

## 2021-11-02 LAB — CBC
HCT: 34.2 % — ABNORMAL LOW (ref 36.0–46.0)
Hemoglobin: 11.8 g/dL — ABNORMAL LOW (ref 12.0–15.0)
MCH: 35.3 pg — ABNORMAL HIGH (ref 26.0–34.0)
MCHC: 34.5 g/dL (ref 30.0–36.0)
MCV: 102.4 fL — ABNORMAL HIGH (ref 80.0–100.0)
Platelets: 49 10*3/uL — ABNORMAL LOW (ref 150–400)
RBC: 3.34 MIL/uL — ABNORMAL LOW (ref 3.87–5.11)
RDW: 13.4 % (ref 11.5–15.5)
WBC: 3.9 10*3/uL — ABNORMAL LOW (ref 4.0–10.5)
nRBC: 0 % (ref 0.0–0.2)

## 2021-11-02 LAB — SURGICAL PATHOLOGY

## 2021-11-02 LAB — BASIC METABOLIC PANEL
Anion gap: 9 (ref 5–15)
BUN: 13 mg/dL (ref 6–20)
CO2: 27 mmol/L (ref 22–32)
Calcium: 8.7 mg/dL — ABNORMAL LOW (ref 8.9–10.3)
Chloride: 101 mmol/L (ref 98–111)
Creatinine, Ser: 0.81 mg/dL (ref 0.44–1.00)
GFR, Estimated: >60 mL/min (ref >60)
Glucose, Bld: 152 mg/dL — ABNORMAL HIGH (ref 70–99)
Potassium: 4.1 mmol/L (ref 3.5–5.1)
Sodium: 137 mmol/L (ref 135–145)

## 2021-11-02 MED ORDER — FOLIC ACID 1 MG PO TABS: 1.0000 mg | ORAL_TABLET | Freq: Every day | ORAL | 0 refills | Status: AC

## 2021-11-02 MED ORDER — PANTOPRAZOLE SODIUM 40 MG PO TBEC
40.0000 mg | DELAYED_RELEASE_TABLET | Freq: Two times a day (BID) | ORAL | 0 refills | Status: AC
Start: 2021-11-02 — End: 2021-12-02

## 2021-11-02 MED ORDER — CIPROFLOXACIN HCL 500 MG PO TABS: 500.0000 mg | ORAL_TABLET | Freq: Two times a day (BID) | ORAL | 0 refills | Status: AC

## 2021-11-02 MED ORDER — THIAMINE HCL 100 MG PO TABS
100.0000 mg | ORAL_TABLET | Freq: Every day | ORAL | 0 refills | Status: AC
Start: 2021-11-03 — End: ?

## 2021-11-02 MED ORDER — ADULT MULTIVITAMIN W/MINERALS CH: 1.0000 | ORAL_TABLET | Freq: Every day | ORAL | Status: AC

## 2021-11-02 NOTE — Progress Notes (Signed)
Inpatient Follow-up/Progress Note   Patient ID: Abigail Burton is a 46 y.o. female.  Overnight Events / Subjective Findings NAEON. Hgb stable. No further n/v. No sob/chest pain. No melena/hematochezia. Tolerating PO. Seen while eating breakfast. Pt planning to f/u with PCP and her hepatologist at [facility]. No other acute gi complaints.  Review of Systems  Constitutional:  Negative for activity change, appetite change, chills, diaphoresis, fatigue, fever and unexpected weight change.  HENT:  Negative for trouble swallowing and voice change.   Respiratory:  Negative for shortness of breath and wheezing.   Cardiovascular:  Negative for chest pain, palpitations and leg swelling.  Gastrointestinal:  Negative for abdominal distention, abdominal pain, anal bleeding, blood in stool, constipation, diarrhea, nausea, rectal pain and vomiting.  Musculoskeletal:  Negative for arthralgias and myalgias.  Skin:  Negative for color change and pallor.  Neurological:  Negative for dizziness, syncope and weakness.  Psychiatric/Behavioral:  Negative for confusion.   All other systems reviewed and are negative.   Medications  Current Facility-Administered Medications:    acetaminophen (TYLENOL) tablet 650 mg, 650 mg, Oral, Q6H PRN **OR** acetaminophen (TYLENOL) suppository 650 mg, 650 mg, Rectal, Q6H PRN, Athena Masse, MD   buPROPion (WELLBUTRIN XL) 24 hr tablet 300 mg, 300 mg, Oral, Daily, Wieting, Richard, MD   cefTRIAXone (ROCEPHIN) 1 g in sodium chloride 0.9 % 100 mL IVPB, 1 g, Intravenous, Q24H, Athena Masse, MD, Stopped at 11/01/21 2213   chlorhexidine (PERIDEX) 0.12 % solution 15 mL, 15 mL, Mouth Rinse, BID, Wieting, Richard, MD, 15 mL at 32/95/18 8416   folic acid (FOLVITE) tablet 1 mg, 1 mg, Oral, Daily, Damita Dunnings, Waldemar Dickens, MD   influenza vac split quadrivalent PF (FLUARIX) injection 0.5 mL, 0.5 mL, Intramuscular, Tomorrow-1000, Wieting, Richard, MD   LORazepam (ATIVAN) injection 0-4 mg, 0-4 mg,  Intravenous, Q6H **FOLLOWED BY** LORazepam (ATIVAN) injection 0-4 mg, 0-4 mg, Intravenous, Q12H, Damita Dunnings, Waldemar Dickens, MD   LORazepam (ATIVAN) tablet 1-4 mg, 1-4 mg, Oral, Q1H PRN **OR** LORazepam (ATIVAN) injection 1-4 mg, 1-4 mg, Intravenous, Q1H PRN, Athena Masse, MD   MEDLINE mouth rinse, 15 mL, Mouth Rinse, q12n4p, Wieting, Richard, MD   morphine (PF) 2 MG/ML injection 2 mg, 2 mg, Intravenous, Q2H PRN, Athena Masse, MD, 2 mg at 11/01/21 0000   multivitamin with minerals tablet 1 tablet, 1 tablet, Oral, Daily, Damita Dunnings, Waldemar Dickens, MD   [COMPLETED] octreotide (SANDOSTATIN) 2 mcg/mL load via infusion 50 mcg, 50 mcg, Intravenous, Once, 50 mcg at 10/31/21 2135 **AND** octreotide (SANDOSTATIN) 500 mcg in sodium chloride 0.9 % 250 mL (2 mcg/mL) infusion, 50 mcg/hr, Intravenous, Continuous, Nance Pear, MD, Last Rate: 25 mL/hr at 11/02/21 0653, 50 mcg/hr at 11/02/21 0653   ondansetron (ZOFRAN) tablet 4 mg, 4 mg, Oral, Q6H PRN **OR** ondansetron (ZOFRAN) injection 4 mg, 4 mg, Intravenous, Q6H PRN, Athena Masse, MD   pantoprazole (PROTONIX) injection 40 mg, 40 mg, Intravenous, BID, Benita Gutter, RPH, 40 mg at 11/01/21 2131   rifaximin (XIFAXAN) tablet 550 mg, 550 mg, Oral, BID, Wieting, Richard, MD, 550 mg at 11/01/21 2143   sertraline (ZOLOFT) tablet 50 mg, 50 mg, Oral, Daily, Wieting, Richard, MD   spironolactone (ALDACTONE) tablet 100 mg, 100 mg, Oral, Daily, Wieting, Richard, MD, 100 mg at 11/01/21 1750   thiamine tablet 100 mg, 100 mg, Oral, Daily **OR** thiamine (B-1) injection 100 mg, 100 mg, Intravenous, Daily, Judd Gaudier V, MD   traZODone (DESYREL) tablet 50 mg, 50 mg, Oral, QHS, Wieting,  Richard, MD, 50 mg at 11/01/21 2143  cefTRIAXone (ROCEPHIN)  IV Stopped (11/01/21 2213)   octreotide  (SANDOSTATIN)    IV infusion 50 mcg/hr (11/02/21 0653)    acetaminophen **OR** acetaminophen, LORazepam **OR** LORazepam, morphine injection, ondansetron **OR** ondansetron (ZOFRAN) IV   Objective     Vitals:   11/01/21 1604 11/01/21 1747 11/01/21 2015 11/02/21 0535  BP: (!) 144/88 132/72 (!) 151/88 114/87  Pulse: 64 72 73 66  Resp:   18 16  Temp:   98.5 F (36.9 C) 98.3 F (36.8 C)  TempSrc:   Oral Oral  SpO2:   93% 97%  Weight:      Height:         Physical Exam Vitals and nursing note reviewed.  Constitutional:      General: She is not in acute distress.    Appearance: She is not ill-appearing, toxic-appearing or diaphoretic.     Comments: Appears older than stated age  HENT:     Head: Normocephalic and atraumatic.     Nose: Nose normal.     Mouth/Throat:     Mouth: Mucous membranes are moist.     Pharynx: Oropharynx is clear.  Eyes:     General: No scleral icterus.    Extraocular Movements: Extraocular movements intact.  Cardiovascular:     Rate and Rhythm: Normal rate and regular rhythm.     Heart sounds: Normal heart sounds. No murmur heard.   No friction rub. No gallop.  Pulmonary:     Effort: Pulmonary effort is normal. No respiratory distress.     Breath sounds: Normal breath sounds. No wheezing, rhonchi or rales.  Abdominal:     General: Abdomen is flat. Bowel sounds are normal. There is no distension.     Palpations: Abdomen is soft.     Tenderness: There is no abdominal tenderness. There is no guarding or rebound.  Musculoskeletal:     Cervical back: Neck supple.     Right lower leg: No edema.     Left lower leg: No edema.  Skin:    General: Skin is warm and dry.     Coloration: Skin is not jaundiced or pale.  Neurological:     General: No focal deficit present.     Mental Status: She is alert and oriented to person, place, and time. Mental status is at baseline.  Psychiatric:        Mood and Affect: Mood normal.        Behavior: Behavior normal.        Thought Content: Thought content normal.        Judgment: Judgment normal.     Laboratory Data Recent Labs  Lab 10/31/21 1934 11/01/21 0239 11/01/21 0758 11/02/21 0514  WBC 8.1  --    --  3.9*  HGB 12.6 11.8* 11.3* 11.8*  HCT 36.2 35.1* 33.5* 34.2*  PLT 78*  --   --  49*   Recent Labs  Lab 10/31/21 1934 11/01/21 0521 11/02/21 0514  NA 137 140 137  K 4.5 4.4 4.1  CL 100 105 101  CO2 '25 25 27  '$ BUN '16 14 13  '$ CREATININE 1.26* 0.85 0.81  CALCIUM 8.9 8.1* 8.7*  PROT 7.2 6.2*  --   BILITOT 0.8 1.0  --   ALKPHOS 95 75  --   ALT 59* 49*  --   AST 140* 91*  --   GLUCOSE 104* 81 152*   Recent Labs  Lab 10/31/21 2002 11/01/21  1610  INR 1.4* 1.4*      Imaging Studies: No results found.  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 - actively on epclusa for HCV - pt reports not taking her lactulose, but having 3+ BM per day - on bb at home - MELD-Na 14-->12; Mdf 18.7   # UGIB - specks of blood on vomit per patient - FOBT negative- had been eating radishes and felt red stool was related to that - vss  - no further episodes of n/v since arrival at hospital   # Acute on chronic macrocytic anemia - hgb on presentation 12.6- only down to 11.8 o/n- stable - vomiting "specks" of blood per patient - suspect bone marrow suppression from etoh a contributing factor to anemia   # Portal HTN # PHG # coagulopathy- inr 1.4 # hypoalbuminemia   # n/v - suspect related to active thc and etoh use   # Alcoholic hepatitis- improving - mdf 18.7   # AKI- resolving   # abdominal discomfort- resolved   # etoh abuse   # THC use   Plan:  -Hgb stable today with no further signs of gib -Consider increasing patient's home beta blocker as she has HR room, given her esophageal varicx. Pt not sure what current dose is but says she's on carvedilol -Extensive discussion on etoh cessation -Continue home epclusa -will need to continue to separate as ppi can affect efficacy. Instructed pt she will need to wake up earlier to take epclusa to separate  out ppi so she will not take ppi in middle of night - protonix 40 mg po bid - continue home diuretics - can d/c on cipro 500 mg bid to complete 7 days course - will need to follow with her [facility] hepatologist - ciwa protocol and supportive care per primary team - thc cessation - peth pending - advance diet as tolerated -Avoid aspirin and NSAIDs (ibuprofen, Motrin, Advil, naproxen, Aleve, Naprosyn, Goody's powder, & BC powder).  - GI to sign off. Available as needed  I personally performed the service.  Management of other medical comorbidities as per primary team  Thank you for allowing [place] to participate in this patient's care. Please don't hesitate to call if any questions or concerns arise.   [name], DO [facility]  Portions of the record may have been created with voice recognition software. Occasional wrong-word or 'sound-a-like' substitutions may have occurred due to the inherent limitations of voice recognition software.  Read the chart carefully and recognize, using context, where substitutions may have occurred.

## 2021-11-02 NOTE — Care Management (Signed)
?  Transition of Care (TOC) Screening Note ? ? ?Patient Details  ?Name: Abigail Burton ?Date of Birth: 1976/04/14 ? ? ?Transition of Care (TOC) CM/SW Contact:    ?Pete Pelt, RN ?Phone Number: ?11/02/2021, 11:47 AM ? ? ? ?Transition of Care Department [facility]) has reviewed patient and no TOC needs have been identified at this time. We will continue to monitor patient advancement through interdisciplinary progression rounds. If new patient transition needs arise, please place a TOC consult. ?  ?

## 2021-11-02 NOTE — Assessment & Plan Note (Signed)
Continue Epclusa 

## 2021-11-02 NOTE — Discharge Summary (Signed)
Physician Discharge Summary   Patient: Abigail Burton MRN: [id number] DOB: [birth year]  Admit date:     10/31/2021  Discharge date: 11/02/21  Discharge Physician: Loletha Grayer   PCP: Moss Mc, MD   Recommendations at discharge:   Follow-up PCP as scheduled Follow-up with your gastroenterologist as scheduled  Discharge Diagnoses: Principal Problem:   Hematemesis Active Problems:   Alcoholic cirrhosis of liver with ascites (HCC)   Alcohol use disorder, moderate, dependence (HCC)   Elevated liver function tests   AKI (acute kidney injury) ([place])   Abdominal pain   HTN (hypertension)   Hepatitis C   Upper GI bleed   Duodenal ulcer   Hospital Course: The patient was admitted to the hospital on 10/31/2021 and discharged on 11/02/2021.  Patient was admitted with hematemesis.  The patient had an upper endoscopy by Dr. Virgina Jock on 11/01/2021 that showed a shallow duodenal ulcer, portal hypertensive gastropathy and grade 2 esophageal varices.  Patient was reevaluated on 11/02/2020 by gastroenterology and was okay for discharge at that point in time.  Initially the patient was on octreotide drip and Protonix IV.  GI recommended oral Protonix twice a day and Cipro to complete a course of empiric antibiotic.  Patient's hemoglobin was stable at 11.8.  Patient did have a low platelet count of 49.  Assessment and Plan: * Hematemesis EGD shows a shallow duodenal ulcer, portal hypertensive gastropathy and grade 2 esophageal varices.  Case discussed with gastroenterology and recommend 72 hours of octreotide drip and patient also on Protonix drip.  Empiric Rocephin while in the hospital and p.o. Cipro upon discharge.  Last hemoglobin 11.8.  Tolerated full liquid diet okay to go home on soft diet as per GI.  Alcoholic cirrhosis of liver with ascites [facility]) Patient declined paracentesis on this hospital stay.  Can go back on Lasix and Aldactone.  Patient takes Xifaxan also at home.  Alcohol use disorder,  moderate, dependence (HCC) No signs of alcohol withdrawal this morning.  The patient must stop alcohol if she is going to be a candidate for liver transplant.  AKI (acute kidney injury) ([place]) Improved with IV fluid hydration.  Creatinine 1.26 and came down to 0.81  Elevated liver function tests Secondary to alcohol.  Abdominal pain Patient states her abdominal pain was from her recent abdominal hernia surgery.  HTN (hypertension) Continue spironolactone.  Can restart Lasix as outpatient  Hepatitis C Continue Epclusa         Consultants: Gastroenterology Procedures performed: EGD Disposition: Home Diet recommendation:  Regular diet DISCHARGE MEDICATION: Allergies as of 11/02/2021       Reactions   Sulfa Antibiotics Other (See Comments)   Reaction: unknown        Medication List     STOP taking these medications    gabapentin 300 MG capsule Commonly known as: NEURONTIN   lactulose 10 GM/15ML solution Commonly known as: CHRONULAC   trazodone 300 MG tablet Commonly known as: DESYREL       TAKE these medications    buPROPion 300 MG 24 hr tablet Commonly known as: WELLBUTRIN XL Take 300 mg by mouth daily.   ciprofloxacin 500 MG tablet Commonly known as: Cipro Take 1 tablet (500 mg total) by mouth 2 (two) times daily for 4 days.   folic acid 1 MG tablet Commonly known as: FOLVITE Take 1 tablet (1 mg total) by mouth daily. Start taking on: November 03, 2021   furosemide 40 MG tablet Commonly known as: LASIX Take 20 mg by  mouth daily.   multivitamin with minerals Tabs tablet Take 1 tablet by mouth daily. Start taking on: November 03, 2021   pantoprazole 40 MG tablet Commonly known as: Protonix Take 1 tablet (40 mg total) by mouth 2 (two) times daily. What changed: when to take this   rifaximin 550 MG Tabs tablet Commonly known as: XIFAXAN Take 1 tablet (550 mg total) by mouth 2 (two) times daily.   sertraline 50 MG tablet Commonly known as:  ZOLOFT Take 50 mg by mouth 2 (two) times a day.   spironolactone 100 MG tablet Commonly known as: ALDACTONE Take 100 mg by mouth daily.   thiamine 100 MG tablet Take 1 tablet (100 mg total) by mouth daily. Start taking on: November 03, 2021         Patient also takes Epclusa Discharge Exam: Danley Danker Weights   10/31/21 1925 11/01/21 1227  Weight: 54.4 kg 55.8 kg   Physical Exam HENT:     Head: Normocephalic.     Mouth/Throat:     Pharynx: No oropharyngeal exudate.  Eyes:     General: Lids are normal.     Conjunctiva/sclera: Conjunctivae normal.  Cardiovascular:     Rate and Rhythm: Normal rate and regular rhythm.     Heart sounds: Normal heart sounds, S1 normal and S2 normal.  Pulmonary:     Breath sounds: Normal breath sounds. No decreased breath sounds, wheezing, rhonchi or rales.  Abdominal:     Palpations: Abdomen is soft.     Tenderness: There is no abdominal tenderness.  Musculoskeletal:     Right lower leg: No swelling.     Left lower leg: No swelling.  Skin:    General: Skin is warm.     Findings: No rash.  Neurological:     Mental Status: She is alert and oriented to person, place, and time.     Condition at discharge: stable  The results of significant diagnostics from this hospitalization (including imaging, microbiology, ancillary and laboratory) are listed below for reference.   Imaging Studies: No results found.  Microbiology: Results for orders placed or performed during the hospital encounter of 10/31/21  Resp Panel by RT-PCR (Flu A&B, Covid) Nasopharyngeal Swab     Status: None   Collection Time: 10/31/21 10:15 PM   Specimen: Nasopharyngeal Swab; Nasopharyngeal(NP) swabs in vial transport medium  Result Value Ref Range Status   SARS Coronavirus 2 by RT PCR NEGATIVE NEGATIVE Final    Comment: (NOTE) SARS-CoV-2 target nucleic acids are NOT DETECTED.  The SARS-CoV-2 RNA is generally detectable in upper respiratory specimens during the acute phase  of infection. The lowest concentration of SARS-CoV-2 viral copies this assay can detect is 138 copies/mL. A negative result does not preclude SARS-Cov-2 infection and should not be used as the sole basis for treatment or other patient management decisions. A negative result may occur with  improper specimen collection/handling, submission of specimen other than nasopharyngeal swab, presence of viral mutation(s) within the areas targeted by this assay, and inadequate number of viral copies(<138 copies/mL). A negative result must be combined with clinical observations, patient history, and epidemiological information. The expected result is Negative.  Fact Sheet for Patients:  EntrepreneurPulse.com.au  Fact Sheet for Healthcare Providers:  IncredibleEmployment.be  This test is no t yet approved or cleared by the [place] FDA and  has been authorized for detection and/or diagnosis of SARS-CoV-2 by FDA under an Emergency Use Authorization (EUA). This EUA will remain  in effect (meaning this  test can be used) for the duration of the COVID-19 declaration under Section 564(b)(1) of the Act, 21 U.S.C.section 360bbb-3(b)(1), unless the authorization is terminated  or revoked sooner.       Influenza A by PCR NEGATIVE NEGATIVE Final   Influenza B by PCR NEGATIVE NEGATIVE Final    Comment: (NOTE) The Xpert Xpress SARS-CoV-2/FLU/RSV plus assay is intended as an aid in the diagnosis of influenza from Nasopharyngeal swab specimens and should not be used as a sole basis for treatment. Nasal washings and aspirates are unacceptable for Xpert Xpress SARS-CoV-2/FLU/RSV testing.  Fact Sheet for Patients: EntrepreneurPulse.com.au  Fact Sheet for Healthcare Providers: IncredibleEmployment.be  This test is not yet approved or cleared by the [place] FDA and has been authorized for detection and/or diagnosis of  SARS-CoV-2 by FDA under an Emergency Use Authorization (EUA). This EUA will remain in effect (meaning this test can be used) for the duration of the COVID-19 declaration under Section 564(b)(1) of the Act, 21 U.S.C. section 360bbb-3(b)(1), unless the authorization is terminated or revoked.  Performed at [facility], [address] [place], [place] [address]   MRSA Next Gen by PCR, Nasal     Status: None   Collection Time: 11/01/21  2:13 PM   Specimen: Nasal Mucosa; Nasal Swab  Result Value Ref Range Status   MRSA by PCR Next Gen NOT DETECTED NOT DETECTED Final    Comment: (NOTE) The GeneXpert MRSA Assay (FDA approved for NASAL specimens only), is one component of a comprehensive MRSA colonization surveillance program. It is not intended to diagnose MRSA infection nor to guide or monitor treatment for MRSA infections. Test performance is not FDA approved in patients less than 39 years old. Performed at [facility], [address] [place], [place] [address]     Labs: CBC: Recent Labs  Lab 10/31/21 1934 11/01/21 0239 11/01/21 0758 11/02/21 0514  WBC 8.1  --   --  3.9*  HGB 12.6 11.8* 11.3* 11.8*  HCT 36.2 35.1* 33.5* 34.2*  MCV 103.4*  --   --  102.4*  PLT 78*  --   --  49*   Basic Metabolic Panel: Recent Labs  Lab 10/31/21 1934 11/01/21 0521 11/02/21 0514  NA 137 140 137  K 4.5 4.4 4.1  CL 100 105 101  CO2 '25 25 27  '$ GLUCOSE 104* 81 152*  BUN '16 14 13  '$ CREATININE 1.26* 0.85 0.81  CALCIUM 8.9 8.1* 8.7*   Liver Function Tests: Recent Labs  Lab 10/31/21 1934 11/01/21 0521  AST 140* 91*  ALT 59* 49*  ALKPHOS 95 75  BILITOT 0.8 1.0  PROT 7.2 6.2*  ALBUMIN 3.8 3.3*     Discharge time spent: greater than 30 minutes.  Signed: Loletha Grayer, MD [facility] 11/02/2021

## 2021-11-02 NOTE — Discharge Instructions (Addendum)
Continue your epclusa ?

## 2021-11-07 LAB — MISC LABCORP TEST (SEND OUT): Labcorp test code: 791584

## 2021-11-07 NOTE — Anesthesia Postprocedure Evaluation (Signed)
Anesthesia Post Note ? ?Patient: Abigail Burton ? ?Procedure(s) Performed: ESOPHAGOGASTRODUODENOSCOPY (EGD) WITH PROPOFOL ? ?Patient location during evaluation: PACU ?Anesthesia Type: General ?Level of consciousness: awake and alert ?Pain management: pain level controlled ?Vital Signs Assessment: post-procedure vital signs reviewed and stable ?Respiratory status: spontaneous breathing, nonlabored ventilation, respiratory function stable and patient connected to nasal cannula oxygen ?Cardiovascular status: blood pressure returned to baseline and stable ?Postop Assessment: no apparent nausea or vomiting ?Anesthetic complications: no ? ? ?No notable events documented. ? ? ?Last Vitals:  ?Vitals:  ? 11/02/21 0825 11/02/21 1145  ?BP: (!) 150/87 (!) 150/87  ?Pulse: 73 73  ?Resp: 16   ?Temp: 36.7 ?C   ?SpO2: 97%   ?  ?Last Pain:  ?Vitals:  ? 11/02/21 0845  ?TempSrc:   ?PainSc: 0-No pain  ? ? ?  ?  ?  ?  ?  ?  ? ?[name] ? ? ? ? ?
